# Patient Record
Sex: Female | Born: 1975 | Race: White | Hispanic: No | Marital: Married | Smoking: Never smoker
Health system: Southern US, Community
[De-identification: ages and names within clinical notes are randomized; demographics above are authoritative.]

## PROBLEM LIST (undated history)

## (undated) DIAGNOSIS — D709 Neutropenia, unspecified: Secondary | ICD-10-CM

## (undated) DIAGNOSIS — G901 Familial dysautonomia [Riley-Day]: Secondary | ICD-10-CM

## (undated) DIAGNOSIS — G43909 Migraine, unspecified, not intractable, without status migrainosus: Secondary | ICD-10-CM

## (undated) DIAGNOSIS — G629 Polyneuropathy, unspecified: Secondary | ICD-10-CM

## (undated) DIAGNOSIS — M3500 Sicca syndrome, unspecified: Secondary | ICD-10-CM

## (undated) DIAGNOSIS — E063 Autoimmune thyroiditis: Secondary | ICD-10-CM

## (undated) DIAGNOSIS — M359 Systemic involvement of connective tissue, unspecified: Secondary | ICD-10-CM

## (undated) DIAGNOSIS — M810 Age-related osteoporosis without current pathological fracture: Secondary | ICD-10-CM

## (undated) HISTORY — DX: Polyneuropathy, unspecified: G62.9

## (undated) HISTORY — DX: Autoimmune thyroiditis: E06.3

## (undated) HISTORY — DX: Neutropenia, unspecified: D70.9

## (undated) HISTORY — DX: Migraine, unspecified, not intractable, without status migrainosus: G43.909

## (undated) HISTORY — DX: Age-related osteoporosis without current pathological fracture: M81.0

## (undated) HISTORY — DX: Systemic involvement of connective tissue, unspecified: M35.9

## (undated) HISTORY — DX: Familial dysautonomia (riley-day): G90.1

## (undated) HISTORY — DX: Sjogren syndrome, unspecified: M35.00

---
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
## 1997-02-15 DIAGNOSIS — I7300 Raynaud's syndrome without gangrene: Secondary | ICD-10-CM | POA: Insufficient documentation

## 2002-02-15 DIAGNOSIS — D709 Neutropenia, unspecified: Secondary | ICD-10-CM | POA: Insufficient documentation

## 2006-11-04 HISTORY — PX: OTHER SURGICAL HISTORY: SHX169

## 2007-02-16 DIAGNOSIS — E063 Autoimmune thyroiditis: Secondary | ICD-10-CM | POA: Insufficient documentation

## 2007-02-16 DIAGNOSIS — M810 Age-related osteoporosis without current pathological fracture: Secondary | ICD-10-CM | POA: Insufficient documentation

## 2009-02-15 DIAGNOSIS — I7381 Erythromelalgia: Secondary | ICD-10-CM | POA: Insufficient documentation

## 2014-10-12 DIAGNOSIS — R52 Pain, unspecified: Secondary | ICD-10-CM | POA: Insufficient documentation

## 2015-10-11 DIAGNOSIS — G629 Polyneuropathy, unspecified: Secondary | ICD-10-CM | POA: Insufficient documentation

## 2015-10-11 DIAGNOSIS — G43909 Migraine, unspecified, not intractable, without status migrainosus: Secondary | ICD-10-CM | POA: Insufficient documentation

## 2015-10-11 DIAGNOSIS — K909 Intestinal malabsorption, unspecified: Secondary | ICD-10-CM | POA: Insufficient documentation

## 2016-03-22 DIAGNOSIS — R768 Other specified abnormal immunological findings in serum: Secondary | ICD-10-CM | POA: Insufficient documentation

## 2016-03-22 DIAGNOSIS — I7300 Raynaud's syndrome without gangrene: Secondary | ICD-10-CM | POA: Insufficient documentation

## 2016-03-22 DIAGNOSIS — I700 Atherosclerosis of aorta: Secondary | ICD-10-CM | POA: Insufficient documentation

## 2016-03-22 DIAGNOSIS — G608 Other hereditary and idiopathic neuropathies: Secondary | ICD-10-CM | POA: Insufficient documentation

## 2016-08-16 DIAGNOSIS — Q7649 Other congenital malformations of spine, not associated with scoliosis: Secondary | ICD-10-CM | POA: Insufficient documentation

## 2016-09-19 DIAGNOSIS — R682 Dry mouth, unspecified: Secondary | ICD-10-CM | POA: Insufficient documentation

## 2016-09-19 DIAGNOSIS — M3500 Sicca syndrome, unspecified: Secondary | ICD-10-CM | POA: Insufficient documentation

## 2016-11-19 DIAGNOSIS — Z79899 Other long term (current) drug therapy: Secondary | ICD-10-CM | POA: Insufficient documentation

## 2018-05-28 ENCOUNTER — Ambulatory Visit: Payer: BLUE CROSS/BLUE SHIELD | Attending: Orthopedic Surgery | Admitting: Physical Therapy

## 2018-05-28 ENCOUNTER — Encounter: Payer: Self-pay | Admitting: Physical Therapy

## 2018-05-28 DIAGNOSIS — M25551 Pain in right hip: Secondary | ICD-10-CM

## 2018-05-28 DIAGNOSIS — R262 Difficulty in walking, not elsewhere classified: Secondary | ICD-10-CM | POA: Insufficient documentation

## 2018-05-28 NOTE — Therapy (Signed)
[facility] [address] [place], [place], [address] Phone: [phone]   Fax:  [phone]  Physical Therapy Evaluation  Patient Details  Name: Leslie Ortiz MRN: [id number] Date of Birth: 03/17/1976 Referring Provider: Lequita HaltAluisio   Encounter Date: 05/28/2018  PT End of Session - 05/28/18 1441    Visit Number  1    Date for PT Re-Evaluation  07/29/18    PT Start Time  1355    PT Stop Time  1445    PT Time Calculation (min)  50 min    Activity Tolerance  Patient tolerated treatment well    Behavior During Therapy  [facility] assessed/performed       History reviewed. No pertinent past medical history.  History reviewed. No pertinent surgical history.  There were no vitals filed for this visit.   Subjective Assessment - 05/28/18 1357    Subjective  Patient reports that she was tripped by dog and sustained a displaced fracture of the greater trochanter, reports that no surgery was needed.  The fall was on May 18th.  REports she used crutches and a walker for about 4-6 weeks with mostly NWBing.      Limitations  Walking;House hold activities    Patient Stated Goals  have no pain, walk better, increased strength, return to pickleball    Currently in Pain?  Yes    Pain Score  2     Pain Location  Hip    Pain Orientation  Right;Anterior;Lateral    Pain Descriptors / Indicators  Aching;Sharp    Pain Type  Acute pain    Pain Radiating Towards  denies    Pain Onset  More than a month ago    Pain Frequency  Constant    Aggravating Factors   twisting, pivoting squatting pain up to 6/10    Pain Relieving Factors  rest, pain can be 0/10    Effect of Pain on Daily Activities  difficulty putting on shoes, cannot play pickle ball         [facility] PT Assessment - 05/28/18 0001      Assessment   Medical Diagnosis  right trochanter fracture    Referring Provider  Aluisio    Onset Date/Surgical Date  03/21/18    Prior Therapy   no      Precautions   Precautions  None      Balance Screen   Has the patient fallen in the past 6 months  Yes    How Ortiz times?  1    Has the patient had a decrease in activity level because of a fear of falling?   No    Is the patient reluctant to leave their home because of a fear of falling?   No      Home Environment   Additional Comments  has stairs, does housework      Prior Function   Level of Independence  Independent    Vocation  Unemployed    Leisure  plays pickleballeveryday      ROM / Strength   AROM / PROM / Strength  AROM;Strength      AROM   AROM Assessment Site  Hip    Right/Left Hip  Right    Right Hip Extension  5    Right Hip Flexion  95    Right Hip External Rotation   20    Right Hip ABduction  20  Strength   Strength Assessment Site  Hip    Right/Left Hip  Right    Right Hip Flexion  4-/5    Right Hip Extension  4-/5    Right Hip External Rotation   3+/5    Right Hip Internal Rotation  4-/5    Right Hip ABduction  4-/5    Right Hip ADduction  4-/5      Flexibility   Soft Tissue Assessment /Muscle Length  yes    Hamstrings  tight    Quadriceps  tight iwth ASIS pain    ITB  tight    Piriformis  tight with some pain      Palpation   Palpation comment  has tenderness in the right GT and ASIS area      Ambulation/Gait   Gait Comments  has decreased toe off, decreased pelvic rotation at toe off, mild antalgic gait, can do stairs step over step but has difficulty coming up due to weakness                Objective measurements completed on examination: See above findings.              PT Education - 05/28/18 1440    Education Details  HEP    Person(s) Educated  Patient    Methods  Explanation;Demonstration;Verbal cues;Handout    Comprehension  Verbalized understanding       PT Short Term Goals - 05/28/18 1719      PT SHORT TERM GOAL #1   Title  independent with initial HEP    Time  8    Period  Weeks     Status  New      PT SHORT TERM GOAL #2   Title  decrease pain 50%     Time  8    Period  Weeks    Status  New      PT SHORT TERM GOAL #3   Title  go up and down stairs without difficulty or pain    Time  8    Period  Weeks    Status  New      PT SHORT TERM GOAL #4   Title  play pickleball without difficulty    Time  8    Period  Weeks    Status  New      PT SHORT TERM GOAL #5   Title  put on shoes and socks without difficulty    Time  8    Period  Weeks    Status  New                Plan - 05/28/18 1533    Clinical Impression Statement  Patient had a fall and fractured her right hip, she was NWBing for about 5 weeks.  She reports recent x-ray showed good healing, she is having some anterior and lateral pain, she is very tight in the right hip.  Has mild decreased toe off with decreased pelvic rotation.  Weak and limited hip flexion.  Difficulty putting on shoes and socks, her goal is to return to playing pickleball in September    Clinical Presentation  Evolving    Clinical Decision Making  Low    Rehab Potential  Good    PT Frequency  2x / week    PT Duration  8 weeks    PT Treatment/Interventions  ADLs/Self Care Home Management;Cryotherapy;Electrical Stimulation;Moist Heat;Iontophoresis 4mg /ml Dexamethasone;Gait training;Stair training;Functional mobility training;Therapeutic activities;Therapeutic exercise;Balance training;Manual techniques;Patient/family  education    Consulted and Agree with Plan of Care  Patient       Patient will benefit from skilled therapeutic intervention in order to improve the following deficits and impairments:  Abnormal gait, Decreased range of motion, Difficulty walking, Decreased activity tolerance, Pain, Decreased balance, Impaired flexibility, Decreased strength, Decreased mobility  Visit Diagnosis: Pain in right hip - Plan: PT plan of care cert/re-cert  Difficulty in walking, not elsewhere classified - Plan: PT plan of care  cert/re-cert     Problem List There are no active problems to display for this patient.   Jearld Lesch., PT 05/28/2018, 5:22 PM  [facility] [address] [place], [place], [address] Phone: [phone]   Fax:  [phone]  Name: Munira Polson MRN: [id number] Date of Birth: 07/09/1976

## 2018-05-28 NOTE — Patient Instructions (Signed)
<HTML><META HTTP-EQUIV="content-type" CONTENT="text/html;charset=utf-8"><STRONG>             Access Code: [email or web address]       <BR>URL: [email or web address]       <BR><SPAN id="[email or web address]">Date: 05/28/2018</SPAN>                                                     <BR>Prepared by: Stacie GlazeMichael Tangelia Sanson                             </STRONG>      <BR><BR><SPAN>Exercises</SPAN>      <UL class="program-note-list"><LI>Seated Table Hamstring Stretch                 - 4 reps                 - 2 sets                 - 20 hold                         - 2x daily                         - 7x weekly                </LI><LI>Supine Piriformis Stretch                 - 5 reps                 - 2 sets                 - 20 hold                         - 2x daily                         - 7x weekly                </LI><LI>Kneeling Hip Flexor Stretch                 - 5 reps                 - 2 sets                 - 20 hold                         - 2x daily                         - 7x weekly                </LI><LI>Supine Bilateral Hip Internal Rotation Stretch                 - 3 reps                 - 1 sets                 - 20 hold                         -  2x daily                         - 7x weekly                </LI><LI>Supine Bridge                 - 10 reps                 - 2 sets                 - 2 hold                         - 2x daily                         - 7x weekly                </LI><LI>Clamshell                 - 10 reps                 - 2 sets                 - 3 hold                         - 2x daily                         - 7x weekly                </LI><LI>Sidestepping                 - 15 reps                 - 2 sets                         - 2x daily                         - 7x weekly                </LI></UL>

## 2018-06-04 ENCOUNTER — Ambulatory Visit: Payer: BLUE CROSS/BLUE SHIELD | Attending: Orthopedic Surgery | Admitting: Physical Therapy

## 2018-06-04 DIAGNOSIS — M25551 Pain in right hip: Secondary | ICD-10-CM

## 2018-06-04 DIAGNOSIS — R262 Difficulty in walking, not elsewhere classified: Secondary | ICD-10-CM | POA: Diagnosis present

## 2018-06-04 NOTE — Therapy (Addendum)
[place], [place], [address] Phone: [phone]   Fax:  [phone]  Physical Therapy Treatment  Patient Details  Name: Leslie Ortiz MRN: [id number] Date of Birth: 12-19-1975 Referring Provider: Wynelle Link   Encounter Date: 06/04/2018  PT End of Session - 06/04/18 1542    Visit Number  2    Date for PT Re-Evaluation  07/29/18    PT Start Time  6579    PT Stop Time  1530    PT Time Calculation (min)  45 min    Activity Tolerance  Patient tolerated treatment well    Behavior During Therapy  [facility] for tasks assessed/performed       No past medical history on file.  No past surgical history on file.  There were no vitals filed for this visit.  Subjective Assessment - 06/04/18 1453    Subjective  Pt reports having a good day. the middle back is hurting more than the hip today.     Currently in Pain?  Yes    Pain Score  2     Pain Location  Back    Pain Orientation  Anterior;Right;Lateral                       [facility] Adult PT Treatment/Exercise - 06/04/18 0001      Exercises   Exercises  Lumbar;Knee/Hip      Lumbar Exercises: Stretches   Passive Hamstring Stretch  Right;3 reps;30 seconds    Piriformis Stretch  Right;3 reps;30 seconds      Lumbar Exercises: Supine   Ab Set  10 reps;1 second diaphragm breathing in hooklying    Clam  20 reps red theraband      Knee/Hip Exercises: Aerobic   Nustep  L3 6 min      Knee/Hip Exercises: Seated   Long Arc Quad  Both;2 sets    Long Arc Quad Weight  3 lbs.    Ball Squeeze  20      Knee/Hip Exercises: Supine   Bridges  Both;1 set;20 reps red theraband    Straight Leg Raises  Both;2 sets;10 reps               PT Short Term Goals - 05/28/18 1719      PT SHORT TERM GOAL #1   Title  independent with initial HEP    Time  8    Period  Weeks    Status  New      PT SHORT TERM GOAL #2   Title  decrease pain 50%     Time   8    Period  Weeks    Status  New      PT SHORT TERM GOAL #3   Title  go up and down stairs without difficulty or pain    Time  8    Period  Weeks    Status  New      PT SHORT TERM GOAL #4   Title  play pickleball without difficulty    Time  8    Period  Weeks    Status  New      PT SHORT TERM GOAL #5   Title  put on shoes and socks without difficulty    Time  8    Period  Weeks    Status  New  Plan - 06/04/18 1542    Clinical Impression Statement  Pt tolerated treatment well. Pt required verbal and tactile cueing to keep the ribs down and engage diaphragm and pelvic floor. Pt stated she was having trouble with HEP. SPTA reviewed them and pt stated she was much more confident after treatment. Pt reported she wants to be able to play pickleball on September 1st as a personal goal. Pt fatigued quickly with hip focused resisted exercise. Pt stated she got a lot out of analogies to help her understand exercises.     Rehab Potential  Good    PT Frequency  2x / week    PT Duration  8 weeks    PT Treatment/Interventions  ADLs/Self Care Home Management;Cryotherapy;Electrical Stimulation;Moist Heat;Iontophoresis 55m/ml Dexamethasone;Gait training;Stair training;Functional mobility training;Therapeutic activities;Therapeutic exercise;Balance training;Manual techniques;Patient/family education       Patient will benefit from skilled therapeutic intervention in order to improve the following deficits and impairments:  Abnormal gait, Decreased range of motion, Difficulty walking, Decreased activity tolerance, Pain, Decreased balance, Impaired flexibility, Decreased strength, Decreased mobility  Visit Diagnosis: Pain in right hip  Difficulty in walking, not elsewhere classified     Problem List There are no active problems to display for this patient.  PHYSICAL THERAPY DISCHARGE SUMMARY  Visits from Start of Care: 2  Plan: Patient agrees to discharge.  Patient  goals were not met. Patient is being discharged due to meeting the stated rehab goals.  ?????       SMaryelizabeth Ortiz SPTA 06/04/2018, 3:51 PM  [address] [address]Phone: [phone]  Fax:  [phone] Name: Leslie StangMRN: [id number] of Birth: 1January 25, 1977

## 2018-06-08 DIAGNOSIS — H9201 Otalgia, right ear: Secondary | ICD-10-CM | POA: Insufficient documentation

## 2018-06-18 ENCOUNTER — Ambulatory Visit: Payer: BLUE CROSS/BLUE SHIELD | Admitting: Physical Therapy

## 2019-01-04 DIAGNOSIS — R5382 Chronic fatigue, unspecified: Secondary | ICD-10-CM | POA: Insufficient documentation

## 2019-02-16 DIAGNOSIS — R55 Syncope and collapse: Secondary | ICD-10-CM | POA: Insufficient documentation

## 2019-02-16 DIAGNOSIS — G901 Familial dysautonomia [Riley-Day]: Secondary | ICD-10-CM | POA: Insufficient documentation

## 2020-01-20 ENCOUNTER — Ambulatory Visit: Payer: BC Managed Care – PPO | Attending: Internal Medicine

## 2020-01-20 DIAGNOSIS — Z23 Encounter for immunization: Secondary | ICD-10-CM

## 2020-01-20 NOTE — Progress Notes (Signed)
   Covid-19 Vaccination Clinic  Name:  Leslie Ortiz    MRN: [id number] DOB: [birth year]  01/20/2020  [name] was observed post Covid-19 immunization for 30 minutes based on pre-vaccination screening without incident. She was provided with Vaccine Information Sheet and instruction to access the V-Safe system.   [name] was instructed to call 911 with any severe reactions post vaccine: Marland Kitchen Difficulty breathing  . Swelling of face and throat  . A fast heartbeat  . A bad rash all over body  . Dizziness and weakness   Immunizations Administered    Name Date Dose VIS Date Route   Pfizer COVID-19 Vaccine 01/20/2020  3:19 PM 0.3 mL 10/15/2019 Intramuscular   Manufacturer: [employer], [employer]   Lot: [id number]   NDC: [address]-5

## 2020-02-14 ENCOUNTER — Ambulatory Visit: Payer: BC Managed Care – PPO | Attending: Internal Medicine

## 2020-02-14 DIAGNOSIS — Z23 Encounter for immunization: Secondary | ICD-10-CM

## 2020-02-14 NOTE — Progress Notes (Signed)
   Covid-19 Vaccination Clinic  Name:  Leslie Ortiz    MRN: [id number] DOB: 07/08/1976  02/14/2020  [name] was observed post Covid-19 immunization for 15 minutes without incident. She was provided with Vaccine Information Sheet and instruction to access the V-Safe system.   [name] was instructed to call 911 with any severe reactions post vaccine: Marland Kitchen Difficulty breathing  . Swelling of face and throat  . A fast heartbeat  . A bad rash all over body  . Dizziness and weakness   Immunizations Administered    Name Date Dose VIS Date Route   Pfizer COVID-19 Vaccine 02/14/2020  3:37 PM 0.3 mL 10/15/2019 Intramuscular   Manufacturer: [employer], [employer]   Lot: [id number]   NDC: [address]-7

## 2020-02-16 DIAGNOSIS — S0300XA Dislocation of jaw, unspecified side, initial encounter: Secondary | ICD-10-CM | POA: Insufficient documentation

## 2020-02-16 DIAGNOSIS — M6289 Other specified disorders of muscle: Secondary | ICD-10-CM | POA: Insufficient documentation

## 2020-03-28 DIAGNOSIS — D229 Melanocytic nevi, unspecified: Secondary | ICD-10-CM | POA: Insufficient documentation

## 2020-03-28 DIAGNOSIS — D239 Other benign neoplasm of skin, unspecified: Secondary | ICD-10-CM | POA: Insufficient documentation

## 2020-07-25 ENCOUNTER — Ambulatory Visit: Payer: BC Managed Care – PPO | Admitting: Allergy and Immunology

## 2020-07-25 ENCOUNTER — Encounter: Payer: Self-pay | Admitting: Allergy and Immunology

## 2020-07-25 ENCOUNTER — Other Ambulatory Visit: Payer: Self-pay

## 2020-07-25 VITALS — BP 102/56 | HR 73 | Temp 98.5°F | Resp 16 | Ht 64.5 in | Wt 90.0 lb

## 2020-07-25 DIAGNOSIS — T7800XA Anaphylactic reaction due to unspecified food, initial encounter: Secondary | ICD-10-CM

## 2020-07-25 DIAGNOSIS — J3089 Other allergic rhinitis: Secondary | ICD-10-CM | POA: Diagnosis not present

## 2020-07-25 DIAGNOSIS — H1013 Acute atopic conjunctivitis, bilateral: Secondary | ICD-10-CM | POA: Diagnosis not present

## 2020-07-25 DIAGNOSIS — H1010 Acute atopic conjunctivitis, unspecified eye: Secondary | ICD-10-CM | POA: Insufficient documentation

## 2020-07-25 MED ORDER — EPINEPHRINE 0.3 MG/0.3ML IJ SOAJ: 0.3000 mg | INTRAMUSCULAR | 1 refills | Status: AC | PRN

## 2020-07-25 MED ORDER — IPRATROPIUM BROMIDE 0.06 % NA SOLN: NASAL | 5 refills | Status: DC

## 2020-07-25 NOTE — Assessment & Plan Note (Addendum)
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  Food allergen skin test the cellar was negative.  The negative predictive value for food and allergen skin testing is excellent, approximately 95%.  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  Careful avoidance of coconut as discussed.  A prescription has been provided for epinephrine auto-injector 2 pack along with instructions for proper administration.  A food allergy action plan has been provided and discussed.

## 2020-07-25 NOTE — Assessment & Plan Note (Signed)
   Aeroallergen avoidance measures have been discussed and provided in written form.  A prescription has been provided for ipratropium 0.06% nasal spray, 1-2 sprays per nostril 2 or 3 times daily if needed.  Nasal saline spray or nasal saline lavage (i.e., NeilMed) is recommended as needed and prior to medicated nasal sprays.

## 2020-07-25 NOTE — Patient Instructions (Addendum)
Allergic rhinitis with a nonallergic component  Aeroallergen avoidance measures have been discussed and provided in written form.  A prescription has been provided for ipratropium 0.06% nasal spray, 1-2 sprays per nostril 2 or 3 times daily if needed.  Nasal saline spray or nasal saline lavage (i.e., NeilMed) is recommended as needed and prior to medicated nasal sprays.  Allergic conjunctivitis  Treatment plan as outlined above for allergic rhinitis.  I have also recommended eye lubricant drops (i.e., Natural Tears) as needed.  Food allergy The patient's history suggests coconut allergy and positive skin test result today confirm this diagnosis.  Food allergen skin test the cellar was negative.  The negative predictive value for food and allergen skin testing is excellent, approximately 95%.  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  Careful avoidance of coconut as discussed.  A prescription has been provided for epinephrine auto-injector 2 pack along with instructions for proper administration.  A food allergy action plan has been provided and discussed.   Return in about 4 months (around 11/24/2020), or if symptoms worsen or fail to improve.  Control of House Dust Mite Allergen  House dust mites play a major role in allergic asthma and rhinitis.  They occur in environments with high humidity wherever human skin, the food for dust mites is found. High levels have been detected in dust obtained from mattresses, pillows, carpets, upholstered furniture, bed covers, clothes and soft toys.  The principal allergen of the house dust mite is found in its feces.  A gram of dust may contain 1,000 mites and 250,000 fecal particles.  Mite antigen is easily measured in the air during house cleaning activities.    1. Encase mattresses,  including the box spring, and pillow, in an air tight cover.  Seal the zipper end of the encased mattresses with wide adhesive tape. 2. Wash the bedding in water of 130 degrees Farenheit weekly.  Avoid cotton comforters/quilts and flannel bedding: the most ideal bed covering is the dacron comforter. 3. Remove all upholstered furniture from the bedroom. 4. Remove carpets, carpet padding, rugs, and non-washable window drapes from the bedroom.  Wash drapes weekly or use plastic window coverings. 5. Remove all non-washable stuffed toys from the bedroom.  Wash stuffed toys weekly. 6. Have the room cleaned frequently with a vacuum cleaner and a damp dust-mop.  The patient should not be in a room which is being cleaned and should wait 1 hour after cleaning before going into the room. 7. Close and seal all heating outlets in the bedroom.  Otherwise, the room will become filled with dust-laden air.  An electric heater can be used to heat the room. 8. Reduce indoor humidity to less than 50%.  Do not use a humidifier.  Control of Dog or Cat Allergen  Avoidance is the best way to manage a dog or cat allergy. If you have a dog or cat and are allergic to dog or cats, consider removing the dog or cat from the home. If you have a dog or cat but don't want to find it a new home, or if your family wants a pet even though someone in the household is allergic, here are some strategies that may help keep symptoms at bay:  1. Keep the pet out of your bedroom and restrict  it to only a few rooms. Be advised that keeping the dog or cat in only one room will not limit the allergens to that room. 2. Don't pet, hug or kiss the dog or cat; if you do, wash your hands with soap and water. 3. High-efficiency particulate air (HEPA) cleaners run continuously in a bedroom or living room can reduce allergen levels over time. 4. Place electrostatic material sheet in the air inlet vent in the bedroom. 5. Regular use of a  high-efficiency vacuum cleaner or a central vacuum can reduce allergen levels. 6. Giving your dog or cat a bath at least once a week can reduce airborne allergen.

## 2020-07-25 NOTE — Assessment & Plan Note (Signed)
   Treatment plan as outlined above for allergic rhinitis.  I have also recommended eye lubricant drops (i.e., Natural Tears) as needed. 

## 2020-07-25 NOTE — Progress Notes (Signed)
New Patient Note  RE: Leslie Ortiz MRN: [id number] DOB: 08/27/1976 Date of Office Visit: 07/25/2020  Referring provider: Laqueta Due., MD Primary care provider: Laqueta Due., MD  Chief Complaint: Sinus Problem and Nasal Congestion   History of present illness: Leslie Ortiz is a 44 y.o. female with autoimmune disease seen today in consultation requested by Ninetta Lights, MD.  She complains of nasal congestion, sinus pressure over the forehead, sinus pressure over the cheekbones, rhinorrhea, postnasal drainage, and periorbital edema.  The symptoms have progressed over the past 2 months.  The symptoms seem to be worse when she goes outdoors and also may be triggered by exposure to a cat.  Her symptoms are also triggered by rapid weather changes and strong aromas, such as perfumes and detergents.  She experiences occasional ocular pruritus, however is uncertain of this is due to environmental allergen exposure or Sjogren's syndrome.  She currently does not take medication for this problem but will use nasal saline spray or nasal saline lavage when needed.  She has no history of symptoms consistent with eczema or asthma. When she was 44 years old she consumed fresh coconut developed hives on her abdomen.  She has avoided fresh coconut since that time.  She is able to consume cooked coconut. Celery has been listed as an allergy on her medical chart for years.  She has had a reaction to Celebrex in the past and is wondering if possibly Salary was written by accident and then carried forward on her chart.  She has not consumed celery for many years but does not recall ever having had a reaction to it.  Assessment and plan: Allergic rhinitis with a nonallergic component  Aeroallergen avoidance measures have been discussed and provided in written form.  A prescription has been provided for ipratropium 0.06% nasal spray, 1-2 sprays per nostril 2 or 3 times daily if needed.  Nasal saline spray or nasal  saline lavage (i.e., NeilMed) is recommended as needed and prior to medicated nasal sprays.  Allergic conjunctivitis  Treatment plan as outlined above for allergic rhinitis.  I have also recommended eye lubricant drops (i.e., Natural Tears) as needed.  Food allergy The patient's history suggests coconut allergy and positive skin test result today confirm this diagnosis.  Food allergen skin test the cellar was negative.  The negative predictive value for food and allergen skin testing is excellent, approximately 95%.  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  Careful avoidance of coconut as discussed.  A prescription has been provided for epinephrine auto-injector 2 pack along with instructions for proper administration.  A food allergy action plan has been provided and discussed.   Meds ordered this encounter  Medications  . EPINEPHrine (AUVI-Q) 0.3 mg/0.3 mL IJ SOAJ injection    Sig: Inject 0.3 mg into the muscle as needed for anaphylaxis.    Dispense:  2 each    Refill:  1  . ipratropium (ATROVENT) 0.06 % nasal spray    Sig: Take 1-2 sprays per nostril 2 or 3 times daily if needed    Dispense:  15 mL    Refill:  5    Diagnostics: Epicutaneous testing: Reactivity to dust mite antigen. Intradermal testing: Reactivity to cat hair. Food allergen skin testing: Reactivity to coconut.  Physical examination: Blood pressure (!) 102/56, pulse 73, temperature 98.5 F (36.9 C), temperature source Oral, resp. rate 16, height 5' 4.5" (1.638 m), weight 90 lb (40.8 kg), SpO2 100 %.  General: Alert, interactive, in no acute distress. HEENT: TMs pearly gray, turbinates mildly edematous without discharge, post-pharynx moderately erythematous. Neck: Supple without lymphadenopathy. Lungs: Clear to auscultation without wheezing,  rhonchi or rales. CV: Normal S1, S2 without murmurs. Abdomen: Nondistended, nontender. Skin: Warm and dry, without lesions or rashes. Extremities:  No clubbing, cyanosis or edema. Neuro:   Grossly intact.  Review of systems:  Review of systems negative except as noted in HPI / PMHx or noted below: Review of Systems  Constitutional: Negative.   HENT: Negative.   Eyes: Negative.   Respiratory: Negative.   Cardiovascular: Negative.   Gastrointestinal: Negative.   Genitourinary: Negative.   Musculoskeletal: Negative.   Skin: Negative.   Neurological: Negative.   Endo/Heme/Allergies: Negative.   Psychiatric/Behavioral: Negative.     Past medical history:  Past Medical History:  Diagnosis Date  . Sjogren's disease (HCC)   . Undifferentiated connective tissue disease (HCC)     Past surgical history:  Past Surgical History:  Procedure Laterality Date  . tear duct replaced Left 2008  . WISDOM TOOTH EXTRACTION  1991    Family history: Family History  Problem Relation Age of Onset  . Allergic rhinitis Sister   . Allergic rhinitis Brother   . Bronchitis Brother   . Bronchitis Maternal Aunt   . Allergic rhinitis Paternal Grandmother   . Lupus Paternal Grandmother   . Asthma Neg Hx   . Eczema Neg Hx   . Urticaria Neg Hx   . Immunodeficiency Neg Hx   . Angioedema Neg Hx     Social history: Social History   Socioeconomic History  . Marital status: Married    Spouse name: Not on file  . Number of children: Not on file  . Years of education: Not on file  . Highest education level: Not on file  Occupational History  . Not on file  Tobacco Use  . Smoking status: Never Smoker  . Smokeless tobacco: Never Used  Vaping Use  . Vaping Use: Never used  Substance and Sexual Activity  . Alcohol use: Never  . Drug use: Never  . Sexual activity: Not on file  Other Topics Concern  . Not on file  Social History Narrative  . Not on file   Social Determinants of Health    Financial Resource Strain:   . Difficulty of Paying Living Expenses: Not on file  Food Insecurity:   . Worried About Programme researcher, broadcasting/film/video in the Last Year: Not on file  . Ran Out of Food in the Last Year: Not on file  Transportation Needs:   . Lack of Transportation (Medical): Not on file  . Lack of Transportation (Non-Medical): Not on file  Physical Activity:   . Days of Exercise per Week: Not on file  . Minutes of Exercise per Session: Not on file  Stress:   . Feeling of Stress : Not on file  Social Connections:   . Frequency of Communication with Friends and Family: Not on file  . Frequency of Social Gatherings with Friends and Family: Not on file  . Attends Religious Services: Not on file  . Active Member of Clubs or Organizations: Not on file  . Attends Banker Meetings: Not on file  . Marital Status: Not on file  Intimate Partner  Violence:   . Fear of Current or Ex-Partner: Not on file  . Emotionally Abused: Not on file  . Physically Abused: Not on file  . Sexually Abused: Not on file    Environmental History: She lives in a house built in 2006 with hardwood floors throughout, gas heat, and central air.  There is no known mold/water damage in the home.  There is a dog and cat in the home, the dog has access to her bedroom.  She is a non-smoker.  Current Outpatient Medications  Medication Sig Dispense Refill  . cycloSPORINE (RESTASIS) 0.05 % ophthalmic emulsion Restasis 0.05 % eye drops in a dropperette    . levothyroxine (SYNTHROID) 50 MCG tablet Take by mouth.    Marland Kitchen LYRICA 50 MG capsule Take 50 mg by mouth 3 (three) times daily.    Marland Kitchen PLAQUENIL 200 MG tablet Take 200 mg by mouth 2 (two) times daily.    Marland Kitchen EPINEPHrine (AUVI-Q) 0.3 mg/0.3 mL IJ SOAJ injection Inject 0.3 mg into the muscle as needed for anaphylaxis. 2 each 1  . ipratropium (ATROVENT) 0.06 % nasal spray Take 1-2 sprays per nostril 2 or 3 times daily if needed 15 mL 5   No current  facility-administered medications for this visit.    Known medication allergies: Allergies  Allergen Reactions  . Celecoxib Hives and Rash    Hives   . Epinephrine Other (See Comments) and Palpitations    Twitch, heart goes crazy   . Levofloxacin Hives and Rash    Hives   . Sulfa Antibiotics Hives and Other (See Comments)    hives   . Codeine Nausea And Vomiting    nausea  . Erythromycin Nausea And Vomiting  . Penicillins Hives and Rash    Hives Hives   . Coconut (Cocos Nucifera) Allergy Skin Test     Positive allergy test 07/25/20    I appreciate the opportunity to take part in Leslie Ortiz's care. Please do not hesitate to contact me with questions.  Sincerely,   R. Jorene Guest, MD

## 2020-07-31 ENCOUNTER — Telehealth: Payer: Self-pay | Admitting: Allergy and Immunology

## 2020-07-31 NOTE — Telephone Encounter (Signed)
Relayed message to patient

## 2020-07-31 NOTE — Telephone Encounter (Signed)
Please advise 

## 2020-07-31 NOTE — Telephone Encounter (Signed)
It is not unusual to have delayed local reactions after testing.  Using antihistamines, topical corticosteroids (such as hydrocortisone 1%), and ice can help to reduce the local reaction. Thanks.

## 2020-07-31 NOTE — Telephone Encounter (Signed)
Pt. Was in on Tuesday 9/21 skin testing was done, three of the areas where she was tested have gotten significantly worse overtime, it's a red bump as if she was bitten by a mosquito. Wanted to know if this is normal or a cause for concern.

## 2020-09-21 DIAGNOSIS — M542 Cervicalgia: Secondary | ICD-10-CM | POA: Insufficient documentation

## 2020-11-27 ENCOUNTER — Ambulatory Visit: Payer: BC Managed Care – PPO | Admitting: Allergy and Immunology

## 2021-02-15 DIAGNOSIS — G588 Other specified mononeuropathies: Secondary | ICD-10-CM | POA: Insufficient documentation

## 2021-03-22 DIAGNOSIS — M7070 Other bursitis of hip, unspecified hip: Secondary | ICD-10-CM | POA: Insufficient documentation

## 2021-04-10 ENCOUNTER — Ambulatory Visit: Payer: 59 | Admitting: Physical Therapy

## 2021-04-11 DIAGNOSIS — R202 Paresthesia of skin: Secondary | ICD-10-CM | POA: Insufficient documentation

## 2021-05-01 ENCOUNTER — Ambulatory Visit: Payer: 59 | Admitting: Physical Therapy

## 2021-05-03 ENCOUNTER — Encounter: Payer: Self-pay | Admitting: Neurology

## 2021-05-03 ENCOUNTER — Ambulatory Visit: Payer: 59 | Admitting: Neurology

## 2021-05-03 VITALS — BP 106/63 | HR 69 | Ht 64.5 in | Wt 93.0 lb

## 2021-05-03 DIAGNOSIS — R208 Other disturbances of skin sensation: Secondary | ICD-10-CM | POA: Diagnosis not present

## 2021-05-03 DIAGNOSIS — G4700 Insomnia, unspecified: Secondary | ICD-10-CM

## 2021-05-03 DIAGNOSIS — G4489 Other headache syndrome: Secondary | ICD-10-CM | POA: Diagnosis not present

## 2021-05-03 DIAGNOSIS — G6289 Other specified polyneuropathies: Secondary | ICD-10-CM | POA: Diagnosis not present

## 2021-05-03 NOTE — Progress Notes (Signed)
[facility]  PATIENT: Leslie Ortiz DOB: Oct 17, 1976  REFERRING DOCTOR OR PCP: Adline Mango, MD SOURCE: Patient, notes from primary care, notes from [place] [facility] and [place] [facility], biopsy results  _________________________________   HISTORICAL  CHIEF COMPLAINT:  Chief Complaint  Patient presents with   New Patient (Initial Visit)    RM 13, alone. Paper referral from Adline Mango, MD at [facility] for headache, small fiber neuropathy. Dx around 2016. Sx have worsened over time. Had EMG/NCS in the past. Prior to small fiber neuropathy biopsy.     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  She is a 45 year old woman who reports burning pain in her legs and feet.   She has superimposed numbness and tingling and sometimes has zapping dysesthesias.   Her hand and feet sometimes turn red, especially in warm temperature and they become very uncomfortable.   They are hot to the touch if others feel tem).   She feels her skin temperature regulatin is poor, in limbs and head.    She had the onset of heat intolerance in 2014.  She felt that she did not sweat after exertion.  Around that time she also noted dry eyes and dry mouth.  In 2014 or 2015 she began to experience dysesthesias.  Specifically she will have a burning type pain.   [facility] that [facility].  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  The skin biopsy showed significant but mildly reduced epidermal fiber density in the thigh (> 8.3 was normal and she was 6.99) but normal in calf (< 4.9 normal and she was 8.75). Marland Kitchen  She was diagnosed with Sjogren's syndrome.  She does not have SSA or SSB but does have a positive ANA.  She was placed on Plaquenil by rheumatology.  She had a tilt table test 04/25/2021   and it showed "Tilt table study positive for neurocardiogenic mechanism to explain the patient's clinical symptoms.  Patient with mainly a  Vaso-depressor response"     MRI of the head 02/28/2021 was normal.    She saw Dolan Amen in 2018.    She gets a lot of headaches, mostly n the forehead.  Sometimes she wakes up with them and other times they develop in the afternoon.   If they occur later in the day, they may trigger a migraine.      She sleeps poorly at night  Medications tried: Lyrica was started in 2006 for headaches and she has continued it at 50 mg once a day and increases to 3 times a day if pain is much worse with some benefit.  Twice a day is tolerated.    She has tried topiramate but it was poorly tolerated.   For headaches she had tried Teacher, music and it caused a very dry mouth (she already has a dry mouth associated with her diagnosis of Sjogren's).     Aspirin had not helped her symptoms and upset her stomach.   Prednisone made her feel better but she has osteoporosis.  Her mom has lupus and she has an aunt with a myeloproliferative disorder. She was not aware of any family history of erythromelalgia.  LABS ESR 22   CRP normal Catecholamines - mildly elevated NE, DA B12 normal 05/17/2019:   ANA positive  1:320 speckled 2/72020  IgM slihtly high at 230  REVIEW OF SYSTEMS: Constitutional: No fevers, chills, sweats, or change in appetite Eyes: No visual changes, double vision, eye pain Ear, nose and throat: No hearing loss, ear pain, nasal congestion, sore throat.  She has dry mouth and dry eyes. Cardiovascular: No chest pain, palpitations Respiratory:  No shortness of breath at rest or with exertion.   No wheezes GastrointestinaI: No nausea, vomiting, diarrhea, abdominal pain, fecal incontinence Genitourinary:  No dysuria, urinary retention or frequency.  No nocturia. Musculoskeletal:  No neck pain, back pain Integumentary: No rash, pruritus, skin lesions Neurological: as  above Psychiatric: No depression at this time.  No anxiety Endocrine: No palpitations, diaphoresis, change in appetite, change in weigh or increased thirst Hematologic/Lymphatic:  No anemia, purpura, petechiae. Allergic/Immunologic: She has a diagnosis of Sjogren's disease.  No itchy/runny eyes, nasal congestion, recent allergic reactions, rashes  ALLERGIES: Allergies  Allergen Reactions   Celecoxib Hives and Rash    Hives    Epinephrine Other (See Comments) and Palpitations    Twitch, heart goes crazy    Levofloxacin Hives and Rash    Hives    Sulfa Antibiotics Hives and Other (See Comments)    hives    Codeine Nausea And Vomiting    nausea   Erythromycin Nausea And Vomiting   Penicillins Hives and Rash    Hives Hives    Coconut (Cocos Nucifera) Allergy Skin Test     Positive allergy test 07/25/20    HOME MEDICATIONS:  Current Outpatient Medications:    cycloSPORINE (RESTASIS) 0.05 % ophthalmic emulsion, Restasis 0.05 % eye drops in a dropperette, Disp: , Rfl:    EPINEPHrine (AUVI-Q) 0.3 mg/0.3 mL IJ SOAJ injection, Inject 0.3 mg into the muscle as needed for anaphylaxis., Disp: 2 each, Rfl: 1   levothyroxine (SYNTHROID) 50 MCG tablet, Take by mouth., Disp: , Rfl:    LYRICA 50 MG capsule, Take 50 mg by mouth 3 (three) times daily., Disp: , Rfl:    PLAQUENIL 200 MG tablet, Take 200 mg by mouth 2 (two) times daily., Disp: , Rfl:   PAST MEDICAL HISTORY: Past Medical History:  Diagnosis Date   Dysautonomia ([place])    Hashimoto's disease    Migraine    Neutropenia (HCC)    Osteoporosis    Sjogren's disease ([place])    Small fiber neuropathy    Undifferentiated connective tissue disease ([place])    Undifferentiated connective tissue disease ([place])     PAST SURGICAL HISTORY: Past Surgical History:  Procedure Laterality Date   tear duct replaced Left 2008   WISDOM TOOTH EXTRACTION  1991    FAMILY HISTORY: Family History  Problem Relation Age of Onset    Arthritis/Rheumatoid Mother    Hypertension Mother    Allergic rhinitis Sister    Allergic rhinitis Brother    Bronchitis Brother    Leukemia Maternal Grandmother    Allergic rhinitis Paternal Grandmother    Lupus Paternal Grandmother    Bronchitis Maternal Aunt    Asthma Neg Hx    Eczema Neg Hx    Urticaria Neg Hx    Immunodeficiency Neg Hx    Angioedema Neg Hx     SOCIAL HISTORY:  Social History   Socioeconomic History   Marital status: Married    Spouse name: Not on file   Number of children: 0   Years of education: 20   Highest education level: Not on file  Occupational History   Not on file  Tobacco Use   Smoking status: Never   Smokeless tobacco: Never  Vaping Use   Vaping Use: Never used  Substance and Sexual Activity   Alcohol use: Never   Drug use: Never   Sexual activity: Not on file  Other Topics Concern   Not on file  Social History Narrative   Right handed   Caffeine use:  coffee daily   Lives w/ family   Social Determinants of Health   Financial Resource Strain: Not on file  Food Insecurity: Not on file  Transportation Needs: Not on file  Physical Activity: Not on file  Stress: Not on file  Social Connections: Not on file  Intimate Partner Violence: Not on file     PHYSICAL EXAM  Vitals:   05/03/21 1018  BP: 106/63  Pulse: 69  Weight: 93 lb (42.2 kg)  Height: 5' 4.5" (1.638 m)    Body mass index is 15.72 kg/m.   General: The patient is well-developed and well-nourished and in no acute distress  HEENT:  Head is [place]/AT.  Sclera are anicteric.    Neck: No carotid bruits are noted.  The neck is nontender.  Cardiovascular: The heart has a regular rate and rhythm with a normal S1 and S2. There were no murmurs, gallops or rubs.    Skin: Extremities are without rash or  edema.  Hands are mildly red.  Musculoskeletal:  Back is nontender  Neurologic Exam  Mental status: The patient is alert and oriented x 3 at the time of the  examination. The patient has apparent normal recent and remote memory, with an apparently normal attention span and concentration ability.   Speech is normal.  Cranial nerves: Extraocular movements are full. Pupils are equal, round, and reactive to light and accomodation.  Visual fields are full.  Facial symmetry is present. There is good facial sensation to soft touch bilaterally.Facial strength is normal.  Trapezius and sternocleidomastoid strength is normal. No dysarthria is noted.  The tongue is midline, and the patient has symmetric elevation of the soft palate. No obvious hearing deficits are noted.  Motor:  Muscle bulk is normal.   Tone is normal. Strength is  5 / 5 in all 4 extremities.   Sensory: Sensory testing is intact to pinprick, soft touch and vibration sensation in the arms.  She has normal sensation at the ankles to pinprick but decreased sensation at the toes.  She has mildly reduced sensation to vibration at the ankles and toes..  Coordination: Cerebellar testing reveals good finger-nose-finger and heel-to-shin bilaterally.  Gait and station: Station is normal.   Gait is normal. Tandem gait is normal. Romberg is negative.   Reflexes: Deep tendon reflexes are symmetric and normal bilaterally.   Plantar responses are flexor.      ASSESSMENT AND PLAN  Small fiber polyneuropathy - Plan: Multiple Myeloma Panel (SPEP&IFE w/QIG)  Dysesthesia  Other headache syndrome  Insomnia, unspecified type  In summary, Ms. Barcomb is a 45 year old woman with heat intolerance, with painful dysesthesias.  She has a diagnosis of Sjogren's disease and also has dry mouth and dry eyes.  Skin biopsy was unusual, consistent with a small fiber polyneuropathy in the thigh but not in the calf.  She has had insomnia.  We will increase Lyrica to 50 mg po qAm and 100 mg po qHS.  Hopefully this will help with the dysesthesias as well as the insomnia.  If symptoms do not improve after a month, she will call  and we  can try lamotrigine or oxcarbazepine or Vimpat  She will return to see [place] in 4 months or sooner if new or worsening symptoms.  Thank you for asking me to see Ms. Sharlene Motts.  Please let me know if I can be of further assistance with her or other patients in the future.  Briseyda Fehr A. Felecia Shelling, MD, [facility] 6/70/1410, 3:01 PM Certified in Neurology, Clinical Neurophysiology, Sleep Medicine and Neuroimaging  [facility] [address] [place], [place] [address] [phone]

## 2021-05-06 LAB — MULTIPLE MYELOMA PANEL, SERUM
Albumin SerPl Elph-Mcnc: 4.3 g/dL (ref 2.9–4.4)
Albumin/Glob SerPl: 1.6 (ref 0.7–1.7)
Alpha 1: 0.2 g/dL (ref 0.0–0.4)
Alpha2 Glob SerPl Elph-Mcnc: 0.5 g/dL (ref 0.4–1.0)
B-Globulin SerPl Elph-Mcnc: 0.9 g/dL (ref 0.7–1.3)
Gamma Glob SerPl Elph-Mcnc: 1.1 g/dL (ref 0.4–1.8)
Globulin, Total: 2.7 g/dL (ref 2.2–3.9)
IgA/Immunoglobulin A, Serum: 240 mg/dL (ref 87–352)
IgG (Immunoglobin G), Serum: 1051 mg/dL (ref 586–1602)
IgM (Immunoglobulin M), Srm: 195 mg/dL (ref 26–217)
Total Protein: 7 g/dL (ref 6.0–8.5)

## 2021-07-18 DIAGNOSIS — G894 Chronic pain syndrome: Secondary | ICD-10-CM | POA: Insufficient documentation

## 2021-09-18 ENCOUNTER — Ambulatory Visit: Payer: 59 | Admitting: Neurology

## 2021-11-16 ENCOUNTER — Other Ambulatory Visit: Payer: Self-pay | Admitting: Dentistry

## 2021-11-16 DIAGNOSIS — M26633 Articular disc disorder of bilateral temporomandibular joint: Secondary | ICD-10-CM

## 2021-12-01 ENCOUNTER — Other Ambulatory Visit: Payer: Self-pay

## 2021-12-01 ENCOUNTER — Ambulatory Visit
Admission: RE | Admit: 2021-12-01 | Discharge: 2021-12-01 | Disposition: A | Discharge status: Home / Self Care | Payer: 59 | Source: Ambulatory Visit | Attending: Dentistry | Admitting: Dentistry

## 2021-12-01 DIAGNOSIS — M26633 Articular disc disorder of bilateral temporomandibular joint: Secondary | ICD-10-CM

## 2021-12-26 DIAGNOSIS — M791 Myalgia, unspecified site: Secondary | ICD-10-CM | POA: Insufficient documentation

## 2022-03-06 DIAGNOSIS — N94819 Vulvodynia, unspecified: Secondary | ICD-10-CM | POA: Insufficient documentation

## 2022-08-30 ENCOUNTER — Other Ambulatory Visit: Payer: Self-pay | Admitting: Internal Medicine

## 2022-08-30 DIAGNOSIS — M359 Systemic involvement of connective tissue, unspecified: Secondary | ICD-10-CM

## 2022-08-30 DIAGNOSIS — R634 Abnormal weight loss: Secondary | ICD-10-CM

## 2022-08-30 DIAGNOSIS — R194 Change in bowel habit: Secondary | ICD-10-CM

## 2022-09-12 ENCOUNTER — Ambulatory Visit
Admission: RE | Admit: 2022-09-12 | Discharge: 2022-09-12 | Disposition: A | Discharge status: Home / Self Care | Payer: 59 | Source: Ambulatory Visit | Attending: Internal Medicine | Admitting: Internal Medicine

## 2022-09-12 DIAGNOSIS — R634 Abnormal weight loss: Secondary | ICD-10-CM

## 2022-09-12 DIAGNOSIS — R194 Change in bowel habit: Secondary | ICD-10-CM

## 2022-09-12 DIAGNOSIS — M359 Systemic involvement of connective tissue, unspecified: Secondary | ICD-10-CM

## 2022-09-12 MED ORDER — IOPAMIDOL (ISOVUE-300) INJECTION 61%
100.0000 mL | Freq: Once | INTRAVENOUS | Status: AC | PRN
  Administered 2022-09-12: 100 mL via INTRAVENOUS

## 2022-10-24 DIAGNOSIS — E2839 Other primary ovarian failure: Secondary | ICD-10-CM | POA: Insufficient documentation

## 2023-02-19 NOTE — Progress Notes (Signed)
Office Visit Note  Patient: Leslie Ortiz             Date of Birth: 05/13/1976           MRN: [id number]             PCP: Laqueta Due., MD Referring: Laqueta Due., MD Visit Date: 02/20/2023   Subjective:  New Patient (Initial Visit) (Patient states she is here because of her Neurologist. Patient states she has muscle pain in random locations and nerve pain down her legs.)   History of Present Illness: Leslie Ortiz is a 47 y.o. female here for Sjogren syndrome. She previously saw [facility] with a long history of care at multiple previous sites. She had onset of several symptoms dating back to her 23s with multiple issues joint pain, unintentional weight loss, and raynaud's symptoms with associated toe lesions leading to workup with positive ANA. Also with proteinuria and she was diagnosed with suspected lupus around 2000 and treated with hydroxychloroquine without benefit and later methotreate that she did not tolerate. Apparently also treated with azathioprine stopped due to leukopenia. She was on a prolonged course of oral prednisone for years around 10 mg daily. Sjogren syndrome diagnosis was defined by 2006 with positive biopsy with [facility] system, positive ANA, hypocomplementemia, and leukopenia. She had seen Dr. Artis Flock with [facility] rheumatology maintained on hydroxychloroquine 200 mg daily. Prednisone was noted to improve symptoms of possible erythromelalgia and her myalgias. Was also treated with Lyrica 50 mg TID with improvement in pain and headache symptoms. Was seeing rheumatology intermittently, records of another workup with [facility] system in 2021. Currently has multiple ongoing symptoms attributed to sjogrens and neuropathy with sicca symptoms, raynaud's, photosensitive rashes, peripheral paresthesias, chronic pain and  autonomic dysfunction with GI malabsorption, neurocardiogenic syncope, and some inappropriate sweating and temperature regulation. For dry eye and irritation using Cequa  solution previously restasis.  DMARD Hx HCQ - Current treatment MTX - GI intolerance AZA - leukopenia (2005)   Previous documentation event summary from 2021 reviewed: 2000 - urine 24-hour protein 520 mg - ANA positive at a titer 1:80 (speckled pattern)  2002 - Began taking Plaquenil 200 mg twice daily,  2004 - DEXA scan T score of -2.2 for hip and -1.6 for spine - positive test for beta-2 glycoprotein 1 antibody  2006 - Rash biopsy thin atrophic epidermis with no interface activity and increased dermal mucin - Normal EMG and nerve conduction velocity study  2007 - Bone marrow biopsy showed cellular marrow with relative erythroid hyperplasia and normal trilineage maturation, storage iron present, no evidence of myelodysplasia - Liver biopsy at [facility] showing 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); MRI/MRCP did not show any features of primary sclerosing cholangitis, although it did reveal hepatomegaly with innumerable small hepatic cysts, prescribed ursodiol but the patient did not start it  2015 - Neurology evaluation for acral pain, paresthesia, and erythema, differential diagnosis included erythromelalgia, small fiber neuropathy, other forms of hereditary sensory neuropathy  2016 - Skin biopsy showed significantly reduced epidermal nerve fiber density consistent with small fiber neuropathy   2017 - Labial salivary gland biopsy focus score 1.3 per 4 mm^2 of tissue  Activities of Daily Living:  Patient reports morning stiffness for 0 minute.   Patient Reports nocturnal pain.  Difficulty dressing/grooming: Denies Difficulty climbing stairs: Denies Difficulty getting out of chair: Denies Difficulty using hands for taps, buttons, cutlery, and/or writing: Denies  Review of Systems  Constitutional:  Positive for fatigue.  HENT:  Positive for mouth dryness. Negative for mouth sores.   Eyes:  Positive for  dryness.  Respiratory:  Negative for shortness of breath.   Cardiovascular:  Positive for palpitations. Negative for chest pain.  Gastrointestinal:  Positive for constipation and diarrhea. Negative for blood in stool.  Endocrine: Positive for increased urination.  Genitourinary:  Negative for involuntary urination.  Musculoskeletal:  Positive for joint pain, joint pain, myalgias, muscle weakness, muscle tenderness and myalgias. Negative for gait problem, joint swelling and morning stiffness.  Skin:  Positive for color change and sensitivity to sunlight. Negative for rash and hair loss.  Allergic/Immunologic: Positive for susceptible to infections.  Neurological:  Positive for dizziness and headaches.  Hematological:  Positive for swollen glands.  Psychiatric/Behavioral:  Positive for sleep disturbance. Negative for depressed mood. The patient is not nervous/anxious.     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   Neck pain 09/21/2020   Allergic rhinitis with a nonallergic component 07/25/2020   Allergic conjunctivitis 07/25/2020   Food allergy 07/25/2020   Dermatofibroma 03/28/2020   Multiple benign nevi 03/28/2020   Pelvic floor dysfunction in female 02/16/2020   TMJ (dislocation of temporomandibular joint) 02/16/2020   Dysautonomia 02/16/2019   Neurocardiogenic syncope 02/16/2019   Chronic fatigue 01/04/2019   Referred otalgia of right ear 06/08/2018   High risk medication use 11/19/2016   Dry mouth 09/19/2016   Primary Sjogren's syndrome 09/19/2016   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   Positive ANA (antinuclear antibody) 03/22/2016   Calcification of aorta 03/22/2016   Raynaud's disease without gangrene 03/22/2016   Neuropathy 10/11/2015   Malabsorption syndrome 10/11/2015   Migraine 10/11/2015   Burning pain 10/12/2014   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   Osteoporosis 02/16/2007   Autoimmune thyroiditis 02/16/2007   Neutropenia 02/15/2002   Raynaud phenomenon 02/15/1997    Past Medical History:  Diagnosis Date   Dysautonomia    Hashimoto's disease    Migraine    Neutropenia    Osteoporosis    Sjogren's disease    Small fiber neuropathy    Undifferentiated connective tissue disease    Undifferentiated connective tissue disease     Family History  Problem Relation Age of Onset   Arthritis/Rheumatoid Mother    Hypertension Mother    Allergic rhinitis Sister    Allergic rhinitis Brother    Bronchitis Brother    Leukemia Maternal Grandmother    Allergic rhinitis Paternal Grandmother    Lupus Paternal Grandmother    Bronchitis Maternal Aunt    Asthma Neg Hx    Eczema Neg Hx    Urticaria Neg Hx    Immunodeficiency Neg Hx    Angioedema Neg Hx    Past Surgical History:  Procedure Laterality Date   tear duct replaced Left 2008   WISDOM TOOTH EXTRACTION  1991   Social History   Social History Narrative   Right handed   Caffeine use:  coffee daily   Lives w/ family   Immunization History  Administered Date(s) Administered   Influenza-Unspecified 08/04/2012   PFIZER Comirnaty(Gray  Top)Covid-19 Tri-Sucrose Vaccine 01/20/2020, 02/14/2020   PFIZER(Purple Top)SARS-COV-2 Vaccination 01/20/2020, 02/14/2020, 07/20/2020     Objective: Vital Signs: BP 105/65 (BP Location: Right Arm, Patient Position: Sitting, Cuff Size: Normal)   Pulse 69   Resp 12   Ht 5' 3.75" (1.619 m)   Wt 82 lb (37.2 kg)   LMP  (LMP Unknown) Comment: Having Irregular Periods  BMI 14.19 kg/m    Physical Exam Constitutional:       Comments: Underweight  HENT:     Mouth/Throat:     Pharynx: Oropharynx is clear.     Comments: Restricted jaw ROM, no palpable crepitus or swelling over TMJ Eyes:     Conjunctiva/sclera: Conjunctivae normal.  Cardiovascular:     Rate and Rhythm: Normal rate and regular rhythm.  Pulmonary:     Effort: Pulmonary effort is normal.     Breath sounds: Normal breath sounds.  Musculoskeletal:     Right lower leg: No edema.     Left lower leg: No edema.  Lymphadenopathy:     Cervical: No cervical adenopathy.  Skin:    Findings: No rash.     Comments: Dilated nailfold capillaries throughout both hands, no pitting, erythema on palms and from partway on proximal phalanx distally b/l  Neurological:     Mental Status: She is alert.  Psychiatric:        Mood and Affect: Mood normal.      Musculoskeletal Exam:  Elbows full ROM no tenderness or swelling Wrists full ROM no tenderness or swelling Fingers full ROM no tenderness or swelling Mildly restricted hip internal rotation without pain Knees full ROM no tenderness or swelling Ankles full ROM no tenderness or swelling   Investigation: No additional findings.  Imaging: No results found.  Recent Labs: Lab Results  Component Value Date   PROT 7.0 05/03/2021    Speciality Comments: No specialty comments available.  Procedures:  No procedures performed Allergies: Celecoxib, Epinephrine, Levofloxacin, Sulfa antibiotics, Codeine, Erythromycin, Penicillins, and [place] nucifera   Assessment / Plan:     Visit Diagnoses: Primary Sjogren's syndrome  Small fiber polyneuropathy - Plan: ANA, Anti-Smith antibody, Sjogrens syndrome-A extractable nuclear antibody, Anti-DNA antibody, double-stranded, C3 and C4, Rheumatoid factor, Sedimentation rate, C-reactive protein, IgG, IgA, IgM  Complicated history but appears to be fairly well established diagnosis from records review. Concern by neurologist is for progression of neuropathy ascending  from peripheral to more proximal distribution including now pudendal neuralgia although worst problems remain hands and feet. She has been on hydroxychloroquine at a relatively very high dosing for many years unclear about symptom efficacy and apparently not stopping associated progression. Will check antibody profile today also serum immunoglobulins and inflammatory markers try to assess ongoing disease activity. If truly has active nerve inflammation with active sjogrens would really need to consider more aggressive treatment whether resuming glucocorticoids, or infusion treatment such as rituximab. Will need to review findings and coordinate with neurology treatment plan.  Raynaud's phenomenon without gangrene  No lesions or critical digital ischemia evident at this time. Has history of previous ulcers especially on toes in the past.  Other osteoporosis without current pathological fracture  Previous greater trochanter fracture from fall. Multifactorial specific worst risks including malabsorption and chronic prednisone use.  Dry mouth  Supportive treatments to continue hydration and saliva stimulation no prescription treatment currently. Would not recommend secretagogue medication in general with existing autonomic dysfunction.  Orders: Orders Placed This Encounter  Procedures   ANA   Anti-Smith antibody  Sjogrens syndrome-A extractable nuclear antibody   Anti-DNA antibody, double-stranded   C3 and C4   Rheumatoid factor   Sedimentation rate   C-reactive protein   IgG, IgA, IgM   No orders of the defined types were placed in this encounter.    Follow-Up Instructions: No follow-ups on file.   Fuller Plan, MD  Note - This record has been created using [employer].  Chart creation errors have been sought, but may not always  have been located. Such creation errors do not reflect on  the standard of medical care.

## 2023-02-20 ENCOUNTER — Ambulatory Visit: Payer: 59 | Attending: Internal Medicine | Admitting: Internal Medicine

## 2023-02-20 ENCOUNTER — Encounter: Payer: Self-pay | Admitting: Internal Medicine

## 2023-02-20 VITALS — BP 105/65 | HR 69 | Resp 12 | Ht 63.75 in | Wt 82.0 lb

## 2023-02-20 DIAGNOSIS — G6289 Other specified polyneuropathies: Secondary | ICD-10-CM

## 2023-02-20 DIAGNOSIS — M818 Other osteoporosis without current pathological fracture: Secondary | ICD-10-CM

## 2023-02-20 DIAGNOSIS — K909 Intestinal malabsorption, unspecified: Secondary | ICD-10-CM

## 2023-02-20 DIAGNOSIS — I7381 Erythromelalgia: Secondary | ICD-10-CM

## 2023-02-20 DIAGNOSIS — R682 Dry mouth, unspecified: Secondary | ICD-10-CM

## 2023-02-20 DIAGNOSIS — I7300 Raynaud's syndrome without gangrene: Secondary | ICD-10-CM

## 2023-02-20 DIAGNOSIS — G901 Familial dysautonomia [Riley-Day]: Secondary | ICD-10-CM

## 2023-02-20 DIAGNOSIS — M3500 Sicca syndrome, unspecified: Secondary | ICD-10-CM

## 2023-02-25 LAB — C3 AND C4
C3 Complement: 80 mg/dL — ABNORMAL LOW (ref 83–193)
C4 Complement: 12 mg/dL — ABNORMAL LOW (ref 15–57)

## 2023-02-25 LAB — ANTI-SMITH ANTIBODY: ENA SM Ab Ser-aCnc: <1 AI

## 2023-02-25 LAB — RHEUMATOID FACTOR: Rheumatoid fact SerPl-aCnc: <10 IU/mL (ref <14)

## 2023-02-25 LAB — ANTI-NUCLEAR AB-TITER (ANA TITER): ANA Titer 1: 1:80 {titer} — ABNORMAL HIGH

## 2023-02-25 LAB — SEDIMENTATION RATE: Sed Rate: 9 mm/h (ref 0–20)

## 2023-02-25 LAB — SJOGRENS SYNDROME-A EXTRACTABLE NUCLEAR ANTIBODY: SSA (Ro) (ENA) Antibody, IgG: <1 AI

## 2023-02-25 LAB — C-REACTIVE PROTEIN: CRP: <3 mg/L (ref <8.0)

## 2023-02-25 LAB — ANA: Anti Nuclear Antibody (ANA): POSITIVE — AB

## 2023-02-25 LAB — IGG, IGA, IGM
IgG (Immunoglobin G), Serum: 1071 mg/dL (ref 600–1640)
IgM, Serum: 201 mg/dL (ref 50–300)
Immunoglobulin A: 266 mg/dL (ref 47–310)

## 2023-02-25 LAB — ANTI-DNA ANTIBODY, DOUBLE-STRANDED: ds DNA Ab: <1 IU/mL

## 2023-05-05 ENCOUNTER — Encounter: Payer: Self-pay | Admitting: Internal Medicine

## 2023-05-20 IMAGING — MR MR [PERSON_NAME]
11 series · 16 of 16 positions shown · non-contrast
Comparison: X-ray temporomandibular joint 06/11/2018.
COMPARISON: X-ray temporomandibular joint 06/11/2018.

Addendum:
CLINICAL DATA: Bilateral tightness and popping with facial
numbness/tingling for 3 months.

EXAM:
MRI OF TEMPOROMANDIBULAR JOINT WITHOUT CONTRAST
TECHNIQUE: Multiplanar, multisequence MR imaging of the temporomandibular joint
was performed following the standard protocol. No intravenous
contrast was administered.

[Series 7: T1 · axial · 4.0mm · 0.59mm/px · z∈[-59,+1]mm · 3 of 16 slices shown]
[im 1/16]
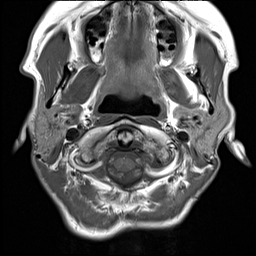
[im 8/16]
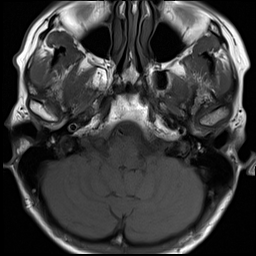
[im 16/16]
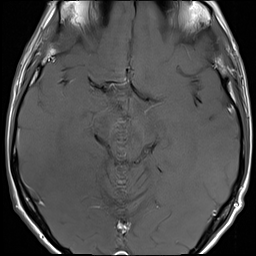

[Series 8: T2 fat-sat · sagittal · 4.0mm · 0.62mm/px · 2 of 13 slices shown (1 of 2)]
[im 1/13]
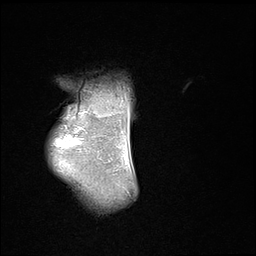
[im 13/13]
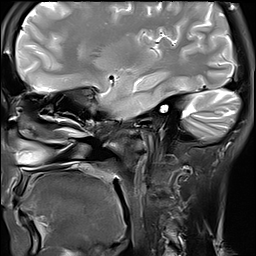

[Series 10: T2 fat-sat · sagittal · 4.0mm · 0.62mm/px · 2 of 13 slices shown (2 of 2)]
[im 1/13]
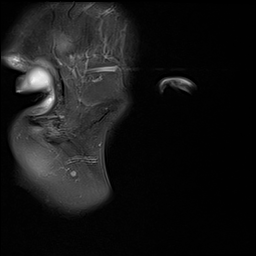
[im 13/13]
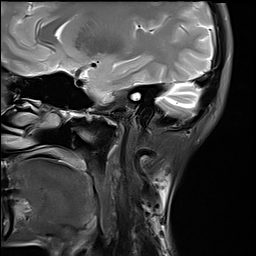

[Series 11: PD · coronal · 4.0mm · 0.44mm/px · 2 of 13 slices shown (1 of 8)]
[im 1/13]
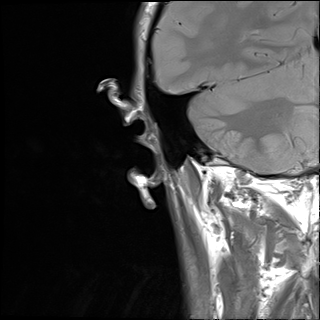
[im 13/13]
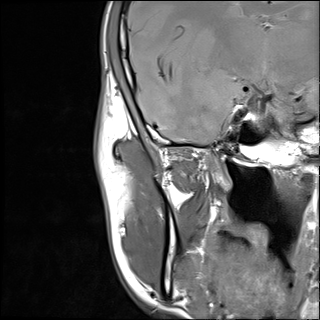

[Series 12: PD · coronal · 4.0mm · 0.44mm/px · 1 of 13 slices shown (2 of 8)]
[im 1/13]
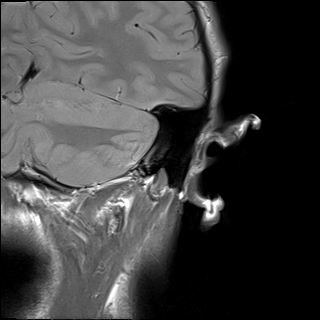

[Series 13: PD · sagittal · 4.0mm · 0.44mm/px · 1 of 13 slices shown (3 of 8)]
[im 1/13]
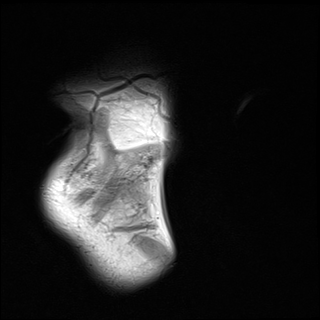

[Series 14: PD · sagittal · 4.0mm · 0.44mm/px · 1 of 12 slices shown (4 of 8)]
[im 1/12]
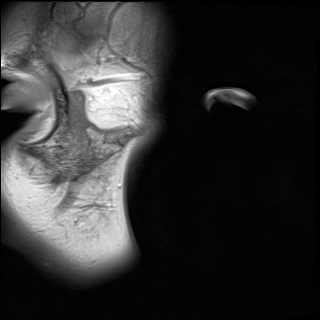

[Series 16: PD · sagittal · 4.0mm · 0.44mm/px · 1 of 13 slices shown (5 of 8)]
[im 1/13]
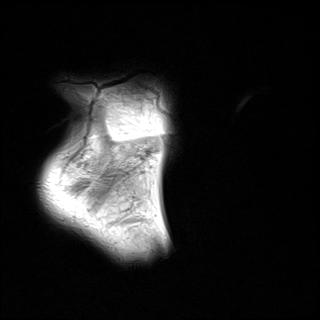

[Series 17: PD · sagittal · 4.0mm · 0.44mm/px · 1 of 12 slices shown (6 of 8)]
[im 1/12]
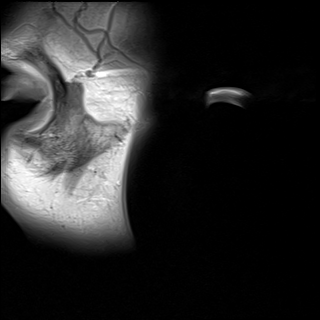

[Series 18: PD · coronal · 4.0mm · 0.44mm/px · 1 of 13 slices shown (7 of 8)]
[im 1/13]
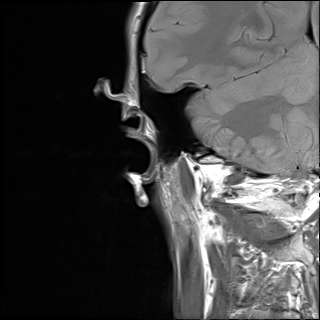

[Series 19: PD · coronal · 4.0mm · 0.44mm/px · 1 of 13 slices shown (8 of 8)]
[im 1/13]
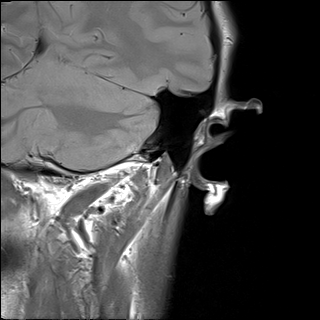

[16 of 16 positions shown; findings below may reference images not displayed]

FINDINGS: Right temporomandibular joint: The articular disc is normally
positioned between the mandibular condyle and the temporal bone in
both open and closed positions with degeneration of the articular
disc. There is anterior translation of the mandibular condyle with
jaw opening, but the condyle does not extend to the inferior most
aspect of the articular eminence. There is no joint effusion.

Left temporomandibular joint: The articular disc is normally
positioned between the mandibular condyle and the temporal bone in
both open and closed positions. Degeneration of the intermediate
zone and posterior band with probable small tear of the intermediate
zone. There is anterior translation of the mandibular condyle with
jaw opening, but the condyle does not extend to the inferior most
aspect of the articular eminence.There is no joint effusion.

Other: No acute fracture or subluxation. No aggressive osseous
lesion. Visualized brain demonstrates no focal abnormality. No fluid
collection or hematoma. Visualized paranasal sinuses are clear.
IMPRESSION: 1. Degeneration of the intermediate zone and posterior band of the
left temporomandibular joint with probable small tear of the
intermediate zone.
2. Degeneration of the articular disc of the right temporomandibular
joint.
3. Limited anterior translation of the mandibular condyle with jaw
opening with the condyle not extending to the inferior most aspect
of the articular eminence.

ADDENDUM:
A 23 mm bite plate was utilized during the open-mouth portion of the
examination. This could contribute to the overall relative decrease
translation of the mandibular condyle with the jaw open relative to
the articular eminence bilaterally.

*** End of Addendum ***
FINDINGS: Right temporomandibular joint: The articular disc is normally
positioned between the mandibular condyle and the temporal bone in
both open and closed positions with degeneration of the articular
disc. There is anterior translation of the mandibular condyle with
jaw opening, but the condyle does not extend to the inferior most
aspect of the articular eminence. There is no joint effusion.

Left temporomandibular joint: The articular disc is normally
positioned between the mandibular condyle and the temporal bone in
both open and closed positions. Degeneration of the intermediate
zone and posterior band with probable small tear of the intermediate
zone. There is anterior translation of the mandibular condyle with
jaw opening, but the condyle does not extend to the inferior most
aspect of the articular eminence.There is no joint effusion.

Other: No acute fracture or subluxation. No aggressive osseous
lesion. Visualized brain demonstrates no focal abnormality. No fluid
collection or hematoma. Visualized paranasal sinuses are clear.
IMPRESSION: 1. Degeneration of the intermediate zone and posterior band of the
left temporomandibular joint with probable small tear of the
intermediate zone.
2. Degeneration of the articular disc of the right temporomandibular
joint.
3. Limited anterior translation of the mandibular condyle with jaw
opening with the condyle not extending to the inferior most aspect
of the articular eminence.

## 2023-10-29 ENCOUNTER — Encounter (HOSPITAL_BASED_OUTPATIENT_CLINIC_OR_DEPARTMENT_OTHER): Payer: Self-pay

## 2023-10-29 ENCOUNTER — Emergency Department (HOSPITAL_BASED_OUTPATIENT_CLINIC_OR_DEPARTMENT_OTHER)
Admission: EM | Admit: 2023-10-29 | Discharge: 2023-10-29 | Disposition: A | Discharge status: Home / Self Care | Payer: 59 | Attending: Emergency Medicine | Admitting: Emergency Medicine

## 2023-10-29 ENCOUNTER — Emergency Department (HOSPITAL_BASED_OUTPATIENT_CLINIC_OR_DEPARTMENT_OTHER): Payer: 59

## 2023-10-29 ENCOUNTER — Other Ambulatory Visit: Payer: Self-pay

## 2023-10-29 DIAGNOSIS — Z1152 Encounter for screening for COVID-19: Secondary | ICD-10-CM | POA: Diagnosis not present

## 2023-10-29 DIAGNOSIS — R0789 Other chest pain: Secondary | ICD-10-CM | POA: Insufficient documentation

## 2023-10-29 LAB — BASIC METABOLIC PANEL
Anion gap: 7 (ref 5–15)
BUN: 12 mg/dL (ref 6–20)
CO2: 28 mmol/L (ref 22–32)
Calcium: 9.4 mg/dL (ref 8.9–10.3)
Chloride: 100 mmol/L (ref 98–111)
Creatinine, Ser: 0.58 mg/dL (ref 0.44–1.00)
GFR, Estimated: >60 mL/min (ref >60)
Glucose, Bld: 149 mg/dL — ABNORMAL HIGH (ref 70–99)
Potassium: 3.5 mmol/L (ref 3.5–5.1)
Sodium: 135 mmol/L (ref 135–145)

## 2023-10-29 LAB — RESP PANEL BY RT-PCR (RSV, FLU A&B, COVID)  RVPGX2
Influenza A by PCR: NEGATIVE
Influenza B by PCR: NEGATIVE
Resp Syncytial Virus by PCR: NEGATIVE
SARS Coronavirus 2 by RT PCR: NEGATIVE

## 2023-10-29 LAB — CBC
HCT: 38 % (ref 36.0–46.0)
Hemoglobin: 13.2 g/dL (ref 12.0–15.0)
MCH: 34.2 pg — ABNORMAL HIGH (ref 26.0–34.0)
MCHC: 34.7 g/dL (ref 30.0–36.0)
MCV: 98.4 fL (ref 80.0–100.0)
Platelets: 171 10*3/uL (ref 150–400)
RBC: 3.86 MIL/uL — ABNORMAL LOW (ref 3.87–5.11)
RDW: 12.1 % (ref 11.5–15.5)
WBC: 2.7 10*3/uL — ABNORMAL LOW (ref 4.0–10.5)
nRBC: 0 % (ref 0.0–0.2)

## 2023-10-29 LAB — TROPONIN I (HIGH SENSITIVITY)
Troponin I (High Sensitivity): 2 ng/L (ref <18)
Troponin I (High Sensitivity): 3 ng/L (ref <18)

## 2023-10-29 LAB — HCG, SERUM, QUALITATIVE: Preg, Serum: NEGATIVE

## 2023-10-29 MED ORDER — LIDOCAINE VISCOUS HCL 2 % MT SOLN: 15.0000 mL | Freq: Once | OROMUCOSAL | Status: DC

## 2023-10-29 MED ORDER — IBUPROFEN 400 MG PO TABS
600.0000 mg | ORAL_TABLET | Freq: Once | ORAL | Status: AC
  Administered 2023-10-29: 600 mg via ORAL
  Filled 2023-10-29: qty 1

## 2023-10-29 MED ORDER — ALUM & MAG HYDROXIDE-SIMETH 200-200-20 MG/5ML PO SUSP: 30.0000 mL | Freq: Once | ORAL | Status: DC

## 2023-10-29 NOTE — ED Triage Notes (Signed)
C/o left sided constant chest pain since yesterday. Denies other symptoms. Pain radiates to upper back.

## 2023-10-29 NOTE — Discharge Instructions (Addendum)
Your cardiac enzymes were normal and your chest x-ray and remaining labs were overall reassuring.  Your risk factors and presentation combined for a low risk presentation, recommend outpatient follow-up with your primary care provider, consideration for outpatient cardiology referral for consideration for outpatient cardiac stress testing.  Return for any severe worsening symptoms.

## 2023-10-29 NOTE — ED Provider Notes (Signed)
[place] [facility] AT [facility] Provider Note   CSN: [id number] Arrival date & time: 10/29/23  1019     History  Chief Complaint  Patient presents with   Chest Pain    Leslie Ortiz is a 47 y.o. female.  HPI   47 year old female with complicated medical history to include Sjogren syndrome with Raynaud's phenomenon, TMJ, dysautonomia, Hashimoto's thyroiditis, undifferentiated connective tissue disease who presents to the emergency department due to chest pain.  The patient states that she has had intermittent pain since yesterday, described as left-sided, radiating to her left shoulder blade, intermittently a squeezing and tight sensation and then resolves.  No cough or shortness of breath, no fevers or chills, no history of DVT or PE, no lower extremity swelling or recent long travel.  No ripping or tearing component to the pain.  The pain is not burning in characteristic.  It is not exertional.  She denies any musculoskeletal chest wall pain.  She denies any rash along the chest wall.  She is currently being treated for a presumptive diagnosis of shingles due to a lesion that appeared on her face.  Home Medications Prior to Admission medications   Medication Sig Start Date End Date Taking? Authorizing Provider  Ascorbic Acid (VITAMIN C) 500 MG CAPS 1,000 mg. 11/22/03   [provider]  Bacillus Coagulans-Inulin (PROBIOTIC) 1-250 BILLION-MG CAPS  10/21/04   [provider]  Calcium Carb-Cholecalciferol (CALCIUM 1000 + D) 1000-20 MG-MCG TABS  10/21/04   [provider]  CEQUA 0.09 % SOLN Apply 1 drop to eye 2 (two) times daily. 01/27/23   [provider]  cholecalciferol (VITAMIN D3) 25 MCG (1000 UNIT) tablet  11/22/03   [provider]  Coenzyme Q10 50 MG CAPS Take by mouth. 11/12/16   [provider]  cycloSPORINE (RESTASIS) 0.05 % ophthalmic emulsion Restasis 0.05 % eye drops in a dropperette Patient not taking:  Reported on 02/20/2023 05/13/18   [provider]  EPINEPHrine (AUVI-Q) 0.3 mg/0.3 mL IJ SOAJ injection Inject 0.3 mg into the muscle as needed for anaphylaxis. Patient not taking: Reported on 02/20/2023 07/25/20   Cristal Ford, MD  levothyroxine (SYNTHROID) 50 MCG tablet Take by mouth. 06/23/20 06/23/21  [provider]  LYRICA 50 MG capsule Take 50 mg by mouth 3 (three) times daily. 04/24/20   [provider]  Magnesium Gluconate 27.5 MG TABS Take by mouth. 11/12/16   [provider]  Omega-3 Fatty Acids (FISH OIL) 1200 MG CAPS  10/21/04   [provider]  PLAQUENIL 200 MG tablet Take 200 mg by mouth 2 (two) times daily. 07/15/20   [provider]      Allergies    Celecoxib, Coconut (cocos nucifera), Diphenhydramine hcl, Epinephrine, Levofloxacin, Sulfa antibiotics, Celery oil, Coconut oil, Codeine, Erythromycin, Penicillins, Tobramycin, and Cocos nucifera    Review of Systems   Review of Systems  Respiratory:  Positive for chest tightness.   Cardiovascular:  Positive for chest pain.  All other systems reviewed and are negative.   Physical Exam Updated Vital Signs BP 123/65   Pulse 78   Temp 98.1 F (36.7 C) (Oral)   Resp 14   Ht 5\' 4"  (1.626 m)   Wt 37.6 kg   SpO2 99%   BMI 14.25 kg/m  Physical Exam Vitals and nursing note reviewed.  Constitutional:      General: She is not in acute distress.    Appearance: She is well-developed.  Comments: Appears malnourished  HENT:     Head: Normocephalic and atraumatic.  Eyes:     Conjunctiva/sclera: Conjunctivae normal.  Cardiovascular:     Rate and Rhythm: Normal rate and regular rhythm.     Pulses: Normal pulses.     Comments: Pulses equal and symmetric bilaterally Pulmonary:     Effort: Pulmonary effort is normal. No respiratory distress.     Breath sounds: Normal breath sounds.  Chest:     Comments: No chest wall tenderness, no rash Abdominal:     Palpations:  Abdomen is soft.     Tenderness: There is no abdominal tenderness.  Musculoskeletal:        General: No swelling.     Cervical back: Neck supple.  Skin:    General: Skin is warm and dry.     Capillary Refill: Capillary refill takes less than 2 seconds.  Neurological:     Mental Status: She is alert.  Psychiatric:        Mood and Affect: Mood normal.     ED Results / Procedures / Treatments   Labs (all labs ordered are listed, but only abnormal results are displayed) Labs Reviewed  BASIC METABOLIC PANEL - Abnormal; Notable for the following components:      Result Value   Glucose, Bld 149 (*)    All other components within normal limits  CBC - Abnormal; Notable for the following components:   WBC 2.7 (*)    RBC 3.86 (*)    MCH 34.2 (*)    All other components within normal limits  RESP PANEL BY RT-PCR (RSV, FLU A&B, COVID)  RVPGX2  HCG, SERUM, QUALITATIVE  TROPONIN I (HIGH SENSITIVITY)  TROPONIN I (HIGH SENSITIVITY)    EKG EKG Interpretation Date/Time:  Wednesday October 29 2023 13:20:45 EST Ventricular Rate:  84 PR Interval:  123 QRS Duration:  94 QT Interval:  380 QTC Calculation: 450 R Axis:   83  Text Interpretation: Sinus rhythm Confirmed by Ernie Avena ([id number]) on 10/29/2023 1:24:12 PM  Radiology DG Chest 2 View Result Date: 10/29/2023 CLINICAL DATA:  Left-sided chest pain beginning yesterday. EXAM: CHEST - 2 VIEW COMPARISON:  12/09/2018 FINDINGS: The heart size and mediastinal contours are within normal limits. Stable pulmonary hyperinflation. Both lungs are clear. The visualized skeletal structures are unremarkable. IMPRESSION: Stable hyperinflation. No active cardiopulmonary disease. Electronically Signed   By: Danae Orleans M.D.   On: 10/29/2023 11:00    Procedures Procedures    Medications Ordered in ED Medications  ibuprofen (ADVIL) tablet 600 mg (600 mg Oral Given 10/29/23 1320)    ED Course/ Medical Decision Making/ A&P             HEART  Score: 3                    Medical Decision Making Amount and/or Complexity of Data Reviewed Labs: ordered. Radiology: ordered.  Risk OTC drugs. Prescription drug management.   47 year old female with complicated medical history to include Sjogren syndrome with Raynaud's phenomenon, TMJ, dysautonomia, Hashimoto's thyroiditis, undifferentiated connective tissue disease who presents to the emergency department due to chest pain.  The patient states that she has had intermittent pain since yesterday, described as left-sided, radiating to her left shoulder blade, intermittently a squeezing and tight sensation and then resolves.  No cough or shortness of breath, no fevers or chills, no history of DVT or PE, no lower extremity swelling or recent long travel.  No ripping or tearing  component to the pain.  The pain is not burning in characteristic.  It is not exertional.  She denies any musculoskeletal chest wall pain.  She denies any rash along the chest wall.  She is currently being treated for a presumptive diagnosis of shingles due to a lesion that appeared on her face.  Medical Decision Making: Marcinda Leaverton is a 47 y.o. female who presented to the ED today with chest pain, detailed above.  Based on patient's comorbidities, patient has a heart score of 3.    Patient placed on continuous vitals and telemetry monitoring while in ED which was reviewed periodically.  Complete initial physical exam performed, notably the patient was CTAB, intact symmetric pulses, no rash or chest wall tenderness.   Reviewed and confirmed nursing documentation for past medical history, family history, social history.    Initial Assessment: With the patient's presentation of left-sided chest pain, most likely diagnosis is esophageal spasm. Considered ACS, musculoskeletal chest pain versus GERD. Other diagnoses were considered including (but not limited to) pulmonary embolism, community-acquired pneumonia, aortic dissection,  pneumothorax, underlying bony abnormality, anemia. These are considered less likely due to history of present illness and physical exam findings.    In particular, concerning pulmonary embolism: Patient is PERC negative and the they deny malignancy, recent surgery, history of DVT, or calf tenderness leading to a low risk Wells score. Aortic Dissection also considered but seems less likely based on the location, quality, onset, and severity of symptoms in this case.   Initial Plan: Evaluate for ACS with delta troponin and EKG evaluated as below  Evaluate for dissection, bony abnormality, or pneumonia with chest x-ray and screening laboratory evaluation including CBC, BMP  Further evaluation for pulmonary embolism not indicated at this time based on patient's PERC and Wells score.  Further evaluation for Thoracic Aortic Dissection not indicated at this time based on patient's clinical history and PE findings.   Initial Study Results: EKG was reviewed independently. Rate, rhythm, axis, intervals all examined and without medically relevant abnormality. ST segments with nonspecific changes, no STEMI.  Laboratory  Delta troponin demonstrated normal values   CBC and BMP without obvious metabolic or inflammatory abnormalities requiring further evaluation   Radiology  DG Chest 2 View Result Date: 10/29/2023 CLINICAL DATA:  Left-sided chest pain beginning yesterday. EXAM: CHEST - 2 VIEW COMPARISON:  12/09/2018 FINDINGS: The heart size and mediastinal contours are within normal limits. Stable pulmonary hyperinflation. Both lungs are clear. The visualized skeletal structures are unremarkable. IMPRESSION: Stable hyperinflation. No active cardiopulmonary disease. Electronically Signed   By: Danae Orleans M.D.   On: 10/29/2023 11:00    Final Assessment and Plan: Patient was offered a GI cocktail but declined.  Also offered IV and oral pain medication, offered Ativan but declined.  Subsequently accepted oral  Motrin.  She primarily wants to rule out ACS.  Low concern for aortic dissection, PERC negative, low suspicion for PE.  Cardiac troponins were normal, COVID and flu influenza unremarkable, hCG negative, BMP unremarkable.  Chest x-ray revealed hyperinflation but no acute findings.  Patient declined any further medical interventions, low suspicion for acute intrathoracic etiology requiring further inpatient hospitalization or diagnostic testing.  Mended outpatient follow-up.  The patient has Flexeril at home which she will try for potential muscle spasm.  Strict return precautions provided, stable for outpatient follow-up.    Final Clinical Impression(s) / ED Diagnoses Final diagnoses:  Atypical chest pain    Rx / DC Orders ED Discharge Orders  None         Ernie Avena, MD 10/29/23 1416

## 2023-11-28 ENCOUNTER — Ambulatory Visit: Payer: 59 | Attending: Internal Medicine | Admitting: Internal Medicine

## 2023-11-28 ENCOUNTER — Encounter: Payer: Self-pay | Admitting: Internal Medicine

## 2023-11-28 VITALS — BP 109/69 | HR 71 | Ht 64.0 in | Wt 83.6 lb

## 2023-11-28 DIAGNOSIS — R072 Precordial pain: Secondary | ICD-10-CM

## 2023-11-28 DIAGNOSIS — I7381 Erythromelalgia: Secondary | ICD-10-CM

## 2023-11-28 DIAGNOSIS — G901 Familial dysautonomia [Riley-Day]: Secondary | ICD-10-CM

## 2023-11-28 DIAGNOSIS — I7300 Raynaud's syndrome without gangrene: Secondary | ICD-10-CM | POA: Diagnosis not present

## 2023-11-28 DIAGNOSIS — E063 Autoimmune thyroiditis: Secondary | ICD-10-CM

## 2023-11-28 DIAGNOSIS — E2839 Other primary ovarian failure: Secondary | ICD-10-CM

## 2023-11-28 DIAGNOSIS — I700 Atherosclerosis of aorta: Secondary | ICD-10-CM | POA: Diagnosis not present

## 2023-11-28 NOTE — Progress Notes (Signed)
Cardiology Office Note:  .   Date:  11/28/2023  ID:  Leslie Ortiz, DOB Nov 01, 1976, MRN [id number] PCP: Laqueta Due., MD   HeartCare Providers Cardiologist:  Parke Poisson, MD    History of Present Illness: .   Leslie Ortiz is a 48 y.o. female.  Discussed the use of AI scribe software for clinical note transcription with the patient, who gave verbal consent to proceed.  History of Present Illness   The patient, with a history of Sjogren's syndrome, Raynaud's, dysautonomia, Hashimoto's, and premature ovarian failure, presents with chest pain that started on Christmas Eve. The pain is described as a squeezing sensation located on the left side of the chest. The pain was initially dismissed as muscular, but it persisted and led to an ER visit. The patient also reports episodes of tachycardia, particularly upon standing up from a lying position. The chest pain eventually eased up but radiated to the back. The patient also experienced rib pain, describing it as if something was rubbing against the ribs. The pain then shifted to a sensation of a stick in the throat and difficulty swallowing. The chest pain is not associated with food intake and seems to worsen with physical activity, such as walking the dog, especially in cold weather. The patient also mentions a history of bradycardia and supraventricular tachycardia.        ROS: negative except per HPI above.  Studies Reviewed: Marland Kitchen   EKG Interpretation Date/Time:  Friday November 28 2023 14:31:58 EST Ventricular Rate:  71 PR Interval:  126 QRS Duration:  86 QT Interval:  426 QTC Calculation: 462 R Axis:   85  Text Interpretation: Normal sinus rhythm Normal ECG When compared with ECG of 29-Oct-2023 13:20, PREVIOUS ECG IS PRESENT Confirmed by Weston Brass ([id number]) on 11/28/2023 2:53:52 PM    Results   LABS Troponins: Normal (10/29/2023) WBC: 1.8 Neutrophils: 0.7  DIAGNOSTIC  Echocardiogram: Normal (01/2020)     Risk  Assessment/Calculations:    Physical Exam:   VS:  BP 109/69   Pulse 71   Ht 5\' 4"  (1.626 m)   Wt 83 lb 9.6 oz (37.9 kg)   SpO2 99%   BMI 14.35 kg/m    Wt Readings from Last 3 Encounters:  11/28/23 83 lb 9.6 oz (37.9 kg)  10/29/23 83 lb (37.6 kg)  02/20/23 82 lb (37.2 kg)     Physical Exam   CHEST: Lungs clear to auscultation. CARDIOVASCULAR: Heart sounds normal.     GEN: Well nourished, well developed in no acute distress NECK: No JVD; No carotid bruits CARDIAC: RRR, no murmurs, rubs, gallops RESPIRATORY:  Clear to auscultation without rales, wheezing or rhonchi  ABDOMEN: Soft, non-tender, non-distended EXTREMITIES:  No edema; No deformity   ASSESSMENT AND PLAN: .    Assessment & Plan Erythromelalgia (HCC)  Precordial pain  Calcification of aorta (HCC)  Raynaud's disease without gangrene  Autoimmune thyroiditis  Premature ovarian failure  Dysautonomia (HCC)   Assessment and Plan    Chest Pain New onset, left-sided, squeezing chest pain that started on Christmas Eve. Pain is not crushing but uncomfortable, and worsens with walking, especially in cold weather. Pain is associated with a sensation of throat flutters and difficulty swallowing. EKGs have been normal. -Order echocardiogram to evaluate for pericarditis and other structural abnormalities. -Order coronary CT scan to evaluate for coronary artery disease. -Order labs including CRP and sedimentation rate to evaluate for inflammation.  Sjogren's Syndrome History of Sjogren's syndrome with associated Raynaud's phenomenon  and dysautonomia. Patient has been managing symptoms with lifestyle modifications including increased salt intake and wearing compression socks. -Consider referral to rheumatology if inflammatory markers are elevated or if pericarditis is identified on echocardiogram.  Hashimoto's Thyroiditis Stable on levothyroxine daily. -Continue current management.  Neutropenia History of  neutropenia with low white blood cell count. Patient has had a previous bone marrow biopsy showing hypercellularity. -Continue monitoring with hematologist.  Follow-up in 4-6 weeks to discuss results of echocardiogram, coronary CT, and labs.

## 2023-11-28 NOTE — Patient Instructions (Addendum)
Medication Instructions:  Your physician recommends that you continue on your current medications as directed. Please refer to the Current Medication list given to you today.  *If you need a refill on your cardiac medications before your next appointment, please call your pharmacy*  Lab Work: BMET, CRP (C-reactive protein), Sed Rate (ESR) If you have labs (blood work) drawn today and your tests are completely normal, you will receive your results only by: MyChart Message (if you have MyChart) OR A paper copy in the mail If you have any lab test that is abnormal or we need to change your treatment, we will call you to review the results.  Testing/Procedures:  Your physician has requested that you have an echocardiogram. Echocardiography is a painless test that uses sound waves to create images of your heart. It provides your doctor with information about the size and shape of your heart and how well your heart's chambers and valves are working. This procedure takes approximately one hour. There are no restrictions for this procedure. Please do NOT wear cologne, perfume, aftershave, or lotions (deodorant is allowed). Please arrive 15 minutes prior to your appointment time. This will take place at [address] [place]     Your cardiac CT will be scheduled at one of the below locations:   [facility] [address] [place], [place] [address] [phone]   If scheduled at [facility], please arrive at the [facility] and Children's Entrance (Entrance C2) of [facility] 30 minutes prior to test start time. You can use the FREE valet parking offered at entrance C (encouraged to control the heart rate for the test)  Proceed to the [facility] Radiology Department (first floor) to check-in and test prep.  All radiology patients and guests should use entrance C2 at [facility], accessed from [facility], even though the hospital's physical  address listed is [address].   Please follow these instructions carefully (unless otherwise directed):  An IV will be required for this test and Nitroglycerin will be given.    On the Night Before the Test: Be sure to Drink plenty of water. Do not consume any caffeinated/decaffeinated beverages or chocolate 12 hours prior to your test. Do not take any antihistamines 12 hours prior to your test.  On the Day of the Test: Drink plenty of water until 1 hour prior to the test. Do not eat any food 1 hour prior to test. You may take your regular medications prior to the test.  If you take Furosemide/Hydrochlorothiazide/Spironolactone/Chlorthalidone, please HOLD on the morning of the test. Patients who wear a continuous glucose monitor MUST remove the device prior to scanning. FEMALES- please wear underwire-free bra if available, avoid dresses & tight clothing  After the Test: Drink plenty of water. After receiving IV contrast, you may experience a mild flushed feeling. This is normal. On occasion, you may experience a mild rash up to 24 hours after the test. This is not dangerous. If this occurs, you can take Benadryl 25 mg and increase your fluid intake. If you experience trouble breathing, this can be serious. If it is severe call 911 IMMEDIATELY. If it is mild, please call our office.  We will call to schedule your test 2-4 weeks out understanding that some insurance companies will need an authorization prior to the service being performed.   For more information and frequently asked questions, please visit our website : [email or web address]  For non-scheduling related  questions, please contact the cardiac imaging nurse navigator should you have any questions/concerns: Cardiac Imaging Nurse Navigators Direct Office Dial: [phone]   For scheduling needs, including cancellations and rescheduling, please call [place], [phone].   Follow-Up: At [facility], you and your health needs are our priority.  As part of our continuing mission to provide you with exceptional heart care, we have created designated Provider Care Teams.  These Care Teams include your primary Cardiologist (physician) and Advanced Practice Providers (APPs -  Physician Assistants and Nurse Practitioners) who all work together to provide you with the care you need, when you need it.  Your next appointment:    Need to call patient and ask if 01/27/2024 at 8:20 am works to follow up with Dr. Jacques Navy (Patient left after [employer] [address])  Provider:   Parke Poisson, MD

## 2023-12-01 ENCOUNTER — Ambulatory Visit (HOSPITAL_COMMUNITY): Payer: 59 | Attending: Cardiology

## 2023-12-01 DIAGNOSIS — R072 Precordial pain: Secondary | ICD-10-CM | POA: Diagnosis present

## 2023-12-01 LAB — ECHOCARDIOGRAM COMPLETE
Area-P 1/2: 3.3 cm2
S' Lateral: 2 cm

## 2023-12-03 LAB — BASIC METABOLIC PANEL
BUN/Creatinine Ratio: 23 (ref 9–23)
BUN: 13 mg/dL (ref 6–24)
CO2: 26 mmol/L (ref 20–29)
Calcium: 9.1 mg/dL (ref 8.7–10.2)
Chloride: 101 mmol/L (ref 96–106)
Creatinine, Ser: 0.57 mg/dL (ref 0.57–1.00)
Glucose: 110 mg/dL — ABNORMAL HIGH (ref 70–99)
Potassium: 4 mmol/L (ref 3.5–5.2)
Sodium: 142 mmol/L (ref 134–144)
eGFR: 112 mL/min/{1.73_m2} (ref >59)

## 2023-12-03 LAB — C-REACTIVE PROTEIN: CRP: <1 mg/L (ref 0–10)

## 2023-12-03 LAB — SEDIMENTATION RATE: Sed Rate: 2 mm/h (ref 0–32)

## 2023-12-08 ENCOUNTER — Encounter: Payer: Self-pay | Admitting: Internal Medicine

## 2023-12-18 ENCOUNTER — Telehealth (HOSPITAL_COMMUNITY): Payer: Self-pay | Admitting: *Deleted

## 2023-12-18 NOTE — Telephone Encounter (Signed)
Reaching out to patient to offer assistance regarding upcoming cardiac imaging study; pt verbalizes understanding of appt date/time, parking situation and where to check in, pre-test NPO status and verified current allergies; name and call back number provided for further questions should they arise  Larey Brick RN Navigator Cardiac Imaging [facility] [phone] office [phone] cell  Pt reports HR is the upper 50's to low 60's at home. She is aware to arrive at 11:30 AM.

## 2023-12-22 ENCOUNTER — Ambulatory Visit (HOSPITAL_COMMUNITY)
Admission: RE | Admit: 2023-12-22 | Discharge: 2023-12-22 | Disposition: A | Discharge status: Home / Self Care | Payer: 59 | Source: Ambulatory Visit | Attending: Internal Medicine | Admitting: Internal Medicine

## 2023-12-22 DIAGNOSIS — R072 Precordial pain: Secondary | ICD-10-CM | POA: Diagnosis present

## 2023-12-22 MED ORDER — DILTIAZEM HCL 25 MG/5ML IV SOLN: 10.0000 mg | INTRAVENOUS | Status: DC | PRN

## 2023-12-22 MED ORDER — IOHEXOL 350 MG/ML SOLN
70.0000 mL | Freq: Once | INTRAVENOUS | Status: AC | PRN
  Administered 2023-12-22: 70 mL via INTRAVENOUS

## 2023-12-22 MED ORDER — METOPROLOL TARTRATE 5 MG/5ML IV SOLN
10.0000 mg | INTRAVENOUS | Status: AC | PRN
  Administered 2023-12-22 (×2): 5 mg via INTRAVENOUS

## 2023-12-22 MED ORDER — IOHEXOL 350 MG/ML SOLN
95.0000 mL | Freq: Once | INTRAVENOUS | Status: AC | PRN
  Administered 2023-12-22: 95 mL via INTRAVENOUS

## 2023-12-22 MED ORDER — METOPROLOL TARTRATE 5 MG/5ML IV SOLN
INTRAVENOUS | Status: AC
Start: 2023-12-22 — End: ?
  Filled 2023-12-22: qty 10

## 2023-12-22 MED ORDER — NITROGLYCERIN 0.4 MG SL SUBL
0.8000 mg | SUBLINGUAL_TABLET | Freq: Once | SUBLINGUAL | Status: AC
  Administered 2023-12-22: 0.8 mg via SUBLINGUAL

## 2023-12-22 MED ORDER — NITROGLYCERIN 0.4 MG SL SUBL
SUBLINGUAL_TABLET | SUBLINGUAL | Status: AC
  Filled 2023-12-22: qty 2

## 2023-12-23 ENCOUNTER — Encounter: Payer: Self-pay | Admitting: *Deleted

## 2024-01-27 ENCOUNTER — Ambulatory Visit: Payer: 59 | Attending: Internal Medicine | Admitting: Internal Medicine

## 2024-01-27 ENCOUNTER — Encounter: Payer: Self-pay | Admitting: Internal Medicine

## 2024-01-27 VITALS — BP 116/74 | HR 62 | Ht 64.0 in | Wt 80.2 lb

## 2024-01-27 DIAGNOSIS — M3500 Sicca syndrome, unspecified: Secondary | ICD-10-CM

## 2024-01-27 DIAGNOSIS — E063 Autoimmune thyroiditis: Secondary | ICD-10-CM | POA: Diagnosis not present

## 2024-01-27 DIAGNOSIS — R072 Precordial pain: Secondary | ICD-10-CM | POA: Diagnosis not present

## 2024-01-27 DIAGNOSIS — I2510 Atherosclerotic heart disease of native coronary artery without angina pectoris: Secondary | ICD-10-CM | POA: Diagnosis not present

## 2024-01-27 DIAGNOSIS — I3139 Other pericardial effusion (noninflammatory): Secondary | ICD-10-CM | POA: Diagnosis not present

## 2024-01-27 NOTE — Progress Notes (Signed)
 Cardiology Office Note:  .   Date:  01/27/2024  ID:  Leslie Ortiz, DOB [birth year], MRN [id number] PCP: Laqueta Due., MD  [facility] Providers Cardiologist:  Parke Poisson, MD    History of Present Illness: .   Leslie Ortiz is a 48 y.o. female.  Discussed the use of AI scribe software for clinical note transcription with the patient, who gave verbal consent to proceed.  History of Present Illness   The patient, with a history of connective tissue disorder and Sjogren's syndrome, presents for a follow-up visit. She reports an improvement in her previously experienced chest pain. However, she continues to experience rib pain, which she describes as a strange sensation, as if her rib bones are hurting. The patient notes that the chest pain seemed to be more prevalent during cold weather and when she was trying to walk. She also mentions experiencing difficulty breathing when lying on her left side, which was particularly noticeable during her recent echo test.  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 She also mentions a potential diagnosis of asthma. The patient has a family history of intolerance to statins, which is relevant as she has some plaque and calcium in her arteries.        ROS: negative except per HPI above.  Studies Reviewed: .        Results   LABS CRP: normal Sedimentation rate: normal  RADIOLOGY CT scan: Multiple hypodense liver lesions, nonobstructive CAD, minimal pericardial fluid  DIAGNOSTIC Echocardiogram: Pericardial effusion, otherwise normal     Risk Assessment/Calculations:             Physical Exam:   VS:  BP 116/74   Pulse 62   Ht 5\' 4"  (1.626 m)   Wt 80 lb 3.2 oz (36.4 kg)   SpO2 100%   BMI 13.77 kg/m    Wt Readings from Last 3 Encounters:  01/27/24 80 lb 3.2 oz (36.4 kg)  11/28/23 83 lb 9.6 oz (37.9 kg)  10/29/23 83 lb (37.6 kg)     Physical Exam   GENERAL: Alert,  cooperative, well developed, no acute distress HEENT: Normocephalic, normal oropharynx, moist mucous membranes CHEST: Clear to auscultation bilaterally, no wheezes, rhonchi, or crackles CARDIOVASCULAR: Normal heart rate and rhythm, S1 and S2 normal without murmurs ABDOMEN: Soft, non-tender, non-distended, without organomegaly, normal bowel sounds EXTREMITIES: No cyanosis or edema NEUROLOGICAL: Cranial nerves grossly intact, moves all extremities without gross motor or sensory deficit      ASSESSMENT AND PLAN: .     Assessment and Plan    Chest Pain Intermittent chest pain improved. Echocardiogram showed small pericardial effusion, no significant abnormalities. Unlikely pericarditis due to normal CRP and sedimentation rate. Possible connective tissue disorder or rib discomfort. - Monitor symptoms and report recurrence or worsening. - Recheck CRP and sedimentation rate if symptoms recur. - Repeat echocardiogram if new symptoms develop.  Pericardial Effusion Small pericardial effusion reduced on CT. No acute pericarditis or significant inflammation. Likely benign, possibly related to previous inflammation or Sjogren's syndrome. - Monitor for symptoms of pericarditis or increased effusion. - Reassess with echocardiogram if symptoms suggest recurrence.  Nonobstructive Coronary Artery Disease (CAD) Nonobstructive CAD with plaque and calcium. No significant blockage. Low cholesterol, family history of statin intolerance. Potential genetic factors, including lipoprotein A. - Discuss statin therapy with primary care physician and rheumatologist. - Consider checking lipoprotein A levels. - Discuss alternative cholesterol-lowering options like Zetia if  statins not tolerated.  Sjogren's Syndrome Underlying Sjogren's syndrome may contribute to inflammation and symptoms. Normal CRP, sedimentation rate suggest no active inflammation. - Continue monitoring for symptoms related to Sjogren's  syndrome. - Follow up with rheumatologist for comprehensive management.  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 Current management includes copper supplementation per patient - Continue copper supplementation as prescribed by hematologist. - Monitor for cardiac or systemic symptoms related to copper metabolism disorder.  Follow-up - Schedule follow-up appointment in one year. - Contact cardiologist if new symptoms arise or concerns about current conditions.

## 2024-01-27 NOTE — Patient Instructions (Signed)
 Medication Instructions:  No Changes  Lab Work: None here today; please have your PCP draw LPa (Lipoprotein A); send [place] the results thru Epic or fax to [phone]  Follow-Up: At [facility], you and your health needs are our priority.  As part of our continuing mission to provide you with exceptional heart care, we have created designated Provider Care Teams.  These Care Teams include your primary Cardiologist (physician) and Advanced Practice Providers (APPs -  Physician Assistants and Nurse Practitioners) who all work together to provide you with the care you need, when you need it.   Your next appointment:   1 year(s)  ; we will mail you a reminder letter around January 2026, please call us then to make appointment for end of March 2026.  Provider:   Parke Poisson, MD      Other Instructions Please call us or send a MyChart message with any Cardiology related concerns. [phone]

## 2024-02-04 ENCOUNTER — Ambulatory Visit: Payer: 59 | Admitting: Pulmonary Disease

## 2024-02-04 ENCOUNTER — Encounter: Payer: Self-pay | Admitting: Pulmonary Disease

## 2024-02-04 VITALS — BP 108/88 | HR 77 | Ht 64.0 in | Wt 81.8 lb

## 2024-02-04 DIAGNOSIS — R059 Cough, unspecified: Secondary | ICD-10-CM | POA: Diagnosis not present

## 2024-02-04 DIAGNOSIS — J3089 Other allergic rhinitis: Secondary | ICD-10-CM | POA: Diagnosis not present

## 2024-02-04 DIAGNOSIS — R0989 Other specified symptoms and signs involving the circulatory and respiratory systems: Secondary | ICD-10-CM | POA: Diagnosis not present

## 2024-02-04 NOTE — Progress Notes (Signed)
 @Patient  ID: Leslie Ortiz, female    DOB: 11-15-1975, 48 y.o.   MRN: [id number]  Chief Complaint  Patient presents with   Consult    Pt states she need 2nd opian before starting med  .    Referring provider: Laqueta Due., MD  HPI:   48 y.o. woman whom are seen for evaluation of hyperinflation on chest imaging, intermittent chest tightness, shortness of breath with exertion particular with cold and warm weather, occasional rib pain.  Multiple notes from referring provider reviewed.  Most recent pulmonary note from [facility] reviewed.  Most recent ENT note reviewed.  Overall, she is doing well.  Day-to-day minimal to no symptoms.  Exercises exert herself without issue.  She notes.  With cold weather sometimes she gets shortness of breath or chest tightness or difficulty when walking her dog outside.  Similar sensation made with steamy shower hot shower or hot weather.  These are intermittent and not reliably reproduced.  She coughs occasionally.  She attributes to dry mouth related to her Sjogren's.  Sometimes her coughing fits she has chest or rib pain.  Localized.  She has had CT scan in 2019 that was reportedly clear.  She had a CT cardiac 12/2023 on my review insufficient reveals clear lungs, hyperinflation, no bronchiectasis, no other abnormality.  Chest x-ray 10/2023 reviewed interpreted as clear lungs with significant hyperinflation on both the PA and lateral view.  She does have seasonal allergy symptoms.  Atopic symptoms.  She denies a history of "asthma."  No other breathing issues or other issues in the past.  She is a never smoker.  Questionaires / Pulmonary Flowsheets:   ACT:      No data to display          MMRC:     No data to display          Epworth:      No data to display          Tests:   FENO:  No results found for: "NITRICOXIDE"  PFT:     No data to display        Spirometry [facility] 12/2023 with ratio of 64%, FEV1 69%,  moderate obstruction on my review interpretation  WALK:      No data to display          Imaging: Personally reviewed and as per EMR and discussion this note No results found.  Lab Results: Personally reviewed CBC    Component Value Date/Time   WBC 2.7 (L) 10/29/2023 1044   RBC 3.86 (L) 10/29/2023 1044   HGB 13.2 10/29/2023 1044   HCT 38.0 10/29/2023 1044   PLT 171 10/29/2023 1044   MCV 98.4 10/29/2023 1044   MCH 34.2 (H) 10/29/2023 1044   MCHC 34.7 10/29/2023 1044   RDW 12.1 10/29/2023 1044    BMET    Component Value Date/Time   NA 142 12/02/2023 1515   K 4.0 12/02/2023 1515   CL 101 12/02/2023 1515   CO2 26 12/02/2023 1515   GLUCOSE 110 (H) 12/02/2023 1515   GLUCOSE 149 (H) 10/29/2023 1044   BUN 13 12/02/2023 1515   CREATININE 0.57 12/02/2023 1515   CALCIUM 9.1 12/02/2023 1515   GFRNONAA >60 10/29/2023 1044    BNP No results found for: "BNP"  ProBNP No results found for: "PROBNP"  Specialty Problems       Pulmonary Problems   Allergic rhinitis with a nonallergic component  Allergies  Allergen Reactions   Celecoxib Hives and Rash    Hives    Coconut (Cocos Nucifera) Swelling   Diphenhydramine Hcl Palpitations    Other Reaction(s): Other (See Comments)  Twitching   Epinephrine Other (See Comments) and Palpitations    Twitch, heart goes crazy    Levofloxacin Hives and Rash    Hives    Sulfa Antibiotics Hives and Other (See Comments)    hives  hives  hives  hives, hives   Celery Oil     Other Reaction(s): Other   Coconut Oil     Other Reaction(s): Other (See Comments)  Positive allergy test 07/25/20   Codeine Nausea And Vomiting    nausea   Erythromycin Nausea And Vomiting   Penicillins Hives and Rash    Hives Hives    Tobramycin Swelling   Cocos Nucifera     Positive allergy test 07/25/20    Immunization History  Administered Date(s) Administered   Influenza-Unspecified 08/04/2012   PFIZER(Purple Top)SARS-COV-2  Vaccination 01/20/2020, 02/14/2020, 07/20/2020    Past Medical History:  Diagnosis Date   Dysautonomia (HCC)    Hashimoto's disease    Migraine    Neutropenia (HCC)    Osteoporosis    Sjogren's disease (HCC)    Small fiber neuropathy    Undifferentiated connective tissue disease (HCC)    Undifferentiated connective tissue disease (HCC)     Tobacco History: Social History   Tobacco Use  Smoking Status Never   Passive exposure: Never  Smokeless Tobacco Never   Counseling given: Not Answered   Continue to not smoke  Outpatient Encounter Medications as of 02/04/2024  Medication Sig   Ascorbic Acid (VITAMIN C) 500 MG CAPS 1,000 mg.   Bacillus Coagulans-Inulin (PROBIOTIC) 1-250 BILLION-MG CAPS    Calcium Carb-Cholecalciferol (CALCIUM 1000 + D) 1000-20 MG-MCG TABS    CEQUA 0.09 % SOLN Apply 1 drop to eye 2 (two) times daily.   cholecalciferol (VITAMIN D3) 25 MCG (1000 UNIT) tablet    Coenzyme Q10 50 MG CAPS Take by mouth.   Copper Gluconate 2 MG TABS Take 2 mg by mouth daily.   EPINEPHrine (AUVI-Q) 0.3 mg/0.3 mL IJ SOAJ injection Inject 0.3 mg into the muscle as needed for anaphylaxis.   LYRICA 50 MG capsule Take 50 mg by mouth 3 (three) times daily.   Magnesium Gluconate 27.5 MG TABS Take by mouth.   Omega-3 Fatty Acids (FISH OIL) 1200 MG CAPS    PLAQUENIL 200 MG tablet Take 200 mg by mouth 2 (two) times daily.   cycloSPORINE (RESTASIS) 0.05 % ophthalmic emulsion Restasis 0.05 % eye drops in a dropperette (Patient not taking: Reported on 02/20/2023)   levothyroxine (SYNTHROID) 50 MCG tablet Take by mouth.   No facility-administered encounter medications on file as of 02/04/2024.     Review of Systems  Review of Systems  No chest pain reliably with exertion.  No orthopnea or PND.  Comprehensive review of systems otherwise negative. Physical Exam  BP 108/88 (BP Location: Left Arm, Patient Position: Sitting, Cuff Size: Normal)   Pulse 77   Ht 5\' 4"  (1.626 m)   Wt 81 lb  12.8 oz (37.1 kg)   SpO2 100%   BMI 14.04 kg/m   Wt Readings from Last 5 Encounters:  02/04/24 81 lb 12.8 oz (37.1 kg)  01/27/24 80 lb 3.2 oz (36.4 kg)  11/28/23 83 lb 9.6 oz (37.9 kg)  10/29/23 83 lb (37.6 kg)  02/20/23 82 lb (37.2 kg)  BMI Readings from Last 5 Encounters:  02/04/24 14.04 kg/m  01/27/24 13.77 kg/m  11/28/23 14.35 kg/m  10/29/23 14.25 kg/m  02/20/23 14.19 kg/m     Physical Exam General: Sitting in chair, no acute distress, thin Eyes: EOMI, no icterus Neck: Supple, no JVP Pulmonary: Clear, normal work of breathing Cardiovascular: Warm, no edema Abdomen: Nondistended MSK: No synovitis, no joint effusion Skin: Erythematous finger digits noted Neuro: Normal gait, no weakness Psych: Normal mood, full affect   Assessment & Plan:   Hyperinflation on chest x-ray: Seen on CT scan.  Never smoker.  Most likely asthma.  Specially given minimal symptoms.  Bronchiolitis obliterans considered.  This is hard to diagnose.  This will require open lung biopsy.  Does not seem necessary given her minimal symptoms.  Occasional cough, chest tightness with cold weather etc. with exertion: High suspicion of asthma given hyperinflation.  Also with atopic symptoms.  See discussion above.  Symptoms are intermittent and relatively minimal.  Try albuterol as needed.  Consider maintenance inhaler in the future if symptoms were to worsen.  Fixed obstruction on spirometry: Diagnosis includes the above.   No follow-ups on file.   Karren Burly, MD 02/04/2024   This appointment required 62 minutes of patient care (this includes precharting, chart review, review of results, face-to-face care, etc.).

## 2024-02-04 NOTE — Patient Instructions (Signed)
 It was nice to meet you  I think the diagnosis of asthma is probably correct  This is based on your pulmonary function test that do show some reduction, in the absence of a history of cigarette smoking asthma is by far the most likely cause.  Your chest x-ray and CT scans in the past with hyperinflated, in the absence of history of cigarette smoking asthma/the most likely cause.  We treat asthma based on the severity of symptoms.  Given the intermittent nature of your symptoms I think using ongoing albuterol as needed would be a reasonable first step.  I recommend using this prior to activities that tend to reproduce concerning symptoms such as the cold etc.  It is possible that chest discomfort or pain around the rib is related to inflammation related to asthma or this could be unrelated and more related to another inflammatory process.  Return to clinic as needed

## 2024-10-14 NOTE — Progress Notes (Signed)
 [facility] [place] NEW PATIENT EVALUATION   Referring Physician:  Deane Camie HERO, MD Primary Care Physician:  Camie HERO Deane, MD   Patient ID:  Leslie Ortiz is a 48 y.o. (DOB 19-Apr-1976) female.    Subjective   Leslie Ortiz is a 48 y.o. female referred for evaluation. History of Present Illness The patient presents for evaluation of small fiber neuropathy.  She has been diagnosed with small fiber neuropathy, which she believes affects her entire body. She suspects this condition is a result of her Sjogren's syndrome. She has undergone nerve tests in the past and has been dealing with these symptoms since her early 55s. She was diagnosed with Sjogren's syndrome in 2016. She is immunocompromised due to low white blood cell count. She experiences a range of symptoms including digestive issues, myalgia, heat and cold intolerance, POTS, sweating, burning mouth, and pudendal neuropathy, which led to her discontinuing her job. She has not sought treatment at [facility]. She has undergone a liver biopsy and bone marrow biopsy. She was initially told she had Peyronie's disease, but it was later determined that she has multiple cysts in her liver. Her liver counts were significantly high for a couple of years. She has nonobstructive coronary artery disease, which she attributes to premature ovarian failure in her 63s, leading to years without estrogen. This also resulted in significant osteoporosis. She first started experiencing issues in graduate school in 2000, when she developed Raynaud's disease and ulcers on her toes. An ANA test and urine test revealed protein leakage and high ANA levels, indicating an autoimmune disease. Her liver counts and blood counts were also abnormal. She has always experienced severe migraines. She underwent an intestinal biopsy 20 years ago due to suspected celiac disease, as she does not absorb fat and has high levels of fecal fat. She believes her small fiber  neuropathy impacts her motility. She is currently taking Lyrica for her neuropathy, which she feels is not a sufficient treatment.  She has a copper deficiency and is under the care of a hematologist. Despite taking copper supplements, her levels remain low. She was previously excreting excess copper in her urine, leading to a suspicion of Wilson's disease. Her ceruloplasmin was low during her last test. She does not believe her copper deficiency is causing her neuropathy, as she only developed the deficiency in the last year or two.  She has been experiencing symptoms of dysautonomia, which she describes as intermittent and not a new development. Her primary care physician initially suspected an ENT issue but later considered a neurological cause. She underwent a tilt table test with Dr. Alycia, a cardiologist, and was diagnosed with neurocardiogenic syncope.  Also notes that she had an abnormal sweat test in her 28s.  Results were not positive for cystic fibrosis, although were reportedly more borderline.  Had additional testing years later that was slightly more positive, but really has not had any significant lung issues or breathing issues consistent with CF.  SOCIAL HISTORY:   Education Level: Graduate school  PAST SURGICAL HISTORY: Liver biopsy, bone marrow biopsy  FAMILY HISTORY - Mother: Rheumatoid arthritis - Father: Myasthenia gravis Negative for Sjogren's syndrome in the family history.    Past Medical History, Past Surgery History, Social History, and Family History were reviewed and updated.    History reviewed. No pertinent past medical history.  Past Surgical History:  Procedure Laterality Date   Upper gastrointestinal endoscopy     2004   Family History[1] Social History[2]  Current Home Medications  Medication Sig  Ascorbic Acid (VITAMIN C) 500 mg CAPS   Bacillus Coagulans-Inulin (PROBIOTIC) 1-250 BILLION-MG CAPS   Calcium Carb-Cholecalciferol (CALCIUM 1000 +  D) 1000-20 MG-MCG TABS   Cholecalciferol (VITAMIN D) 25 mcg (1000 Units) tablet   Coenzyme Q10 (COQ-10) 100 MG CAPS   hydroxychloroquine (PLAQUENIL) 200 MG tablet   levothyroxine sodium (SYNTHROID) 50 mcg tablet   Magnesium 500 MG CAPS   Omega-3 Fatty Acids (FISH OIL) 1200 mg capsule   pregabalin (LYRICA) 50 mg capsule     The patient is allergic to celecoxib, coconut, diphenhydramine hcl, epinephrine , levofloxacin, sulfa antibiotics, celery, coconut (cocos nucifera) allergy  skin test, coconut oil, codeine, erythromycin, erythromycin base, levaquin, penicillins, and tobramycin.  Review of Systems is complete and negative except as noted in History of Present Illness.  Objective    PHYSICAL EXAM: BP 106/65 (BP Location: Left Upper Arm, Patient Position: Sitting)   Pulse 70   Ht 5' 4 (1.626 m)   Wt 82 lb 9.6 oz (37.5 kg)   SpO2 100%   BMI 14.18 kg/m  GENERAL:  No acute distress.  SKIN:   Inspection: well perfused, erythremia in the bilateral hands  MENTAL STATUS EXAM: Orientation: Alert and oriented to person, place and time. Memory: Cooperative, follows commands well. Recent and remote memory normal.. Attention, concentration: Attention span and concentration are normal. Language: Speech is clear and language is normal. Fund of knowledge: Aware of current events, vocabulary appropriate for patient age.   CRANIAL NERVES: CN 2 (Optic): Visual fields intact to confrontation CN 3,4,6 (EOM): Full extraocular eye movement without nystagmus. CN 7 (Facial): No facial weakness or asymmetry. CN 8 (Auditory): Auditory acuity grossly normal. CN 11 (spinal access): Normal sternocleidomastoid and trapezius strength.   MOTOR: Muscle Strength: Strength - 5/5 in the upper and lower extremities, no pronation or drift.  COORDINATION:   No tremor.   SENSATION:  Intact to light touch.  GAIT: Routine gait is normal.     DIAGNOSTIC STUDIES:   Tilt-Table Study (04/25/2021): .SABRASABRADuring  the initial heads-up tilt table position the patient's vital signs remained stable until 16 minutes into the study when the patient began to complain of dizziness, nausea, presyncope, and symptoms identical to their clinical symptoms. During the heads-up tilt table position the patient's systolic blood pressure ranged from 102 mmHg to 120 mmHg and heart rate ranged from 70 bpm to 95 bpm. When the patient developed clinical symptoms during the tilt table study patient's blood pressure was 83 / 50 and heart rate was 78 bpm. During the patient's clinical symptoms with the tilt table study the patient did develop pre-syncope... Impression: Tilt table study positive for neurocardiogenic mechanism to explain the patient's clinical symptoms. Patient with mainly a Vaso-depressor response.  MRI of the brain with and without contrast (02/28/2021):  No acute intracranial abnormality.  MRI of the thoracic spine with and without contrast (01/05/2019):  Normal MRI of the thoracic spine.  EMG/NCS @ [facility] (02/13/2017):  This is an unremarkable study. There is no clear evidence for a right lumbosacral radiculopathy versus right leg mononeuropathy.  MRI of the lumbar spine without contrast (02/06/2017):  Mild spondylosis most notable at L5-S1 where there is a shallow broad-based disc bulge without central canal or foraminal stenosis. The central canal and foramina appear open at all levels. Rudimentary disc material at S1-2. Please correlate with plain films if any intervention is planned.  Skin Biopsy (11/16/2014): There is no evidence of vasculitis or other histological  abnormalities based on H&E stain. No amyloid is detected by the [place] Red stain. Both of these disorders are focal and this analysis does not exclude the diagnosis.   CT abdomen and pelvis 08/06/2023 No acute findings No bowel dilation or wall thickening multiple hepatic cysts  LABORATORY STUDIES:   Lab Results  Component Value Date    Glucose, Random External 88 08/03/2024   Hemoglobin A1c EXTERNAL 5.1 11/12/2023   TSH External 1.70 08/16/2022   IgA 261 07/23/2023   Total Protein External 7.1 08/03/2024   Gyn Note Comment 09/18/2012   CRP <1 07/23/2023   Magnesium External 2.4 08/16/2022   Prior Serologic Studies:  Normal/negative -ESR, SSA, SSB, anti-sm antibody, anti-Jo1, anti-Scl70, RF, ENA, SPEP/IFE, Vitamin B12, Folate, Vitamin A, Vitamin E.  Abnormal labs: ANA 1:320, + salivary land biopsy. CBC abnormal with leukopenia and macrocytic anemia.  Labs 10/16/22:  Normal/negative-copper, B1, B3, B6, vitamin E, B12, MMA, folate, SPEP/IFE, FLC.  Gene Dx hereditary sensory and autonomic neuropathy panel was negative.    Assessment / Plan   Assessment & Plan 1. Small fiber neuropathy. Been diagnosed with a small fiber neuropathy, that I doubt this is actually the primary cause, but rather a symptom of of a much bigger issue, potentially genetic.  She is currently on Lyrica, which provides some relief but does not treat the underlying condition. The complexity of her case necessitates a thorough review of her medical records, including previous nerve tests and biopsies.  Based on everything, I would wonder about some type of absorption type problem given that she had a history of vitamin deficiencies, GI problems and multiple system involvement.  Could consider an L-carnitine transferase deficiency or possibly even changing fatty acid deficiency.  Very long chain acetyl-CaA dehydrogenase deficiency could also be a possibility, but admittedly a lot of these are beyond my scope of expertise.  It is doubtful that additional neurologic testing would be of benefit (as she has already had an extremely thorough workup at different institutions), but I would like to review the records further to see if at least we could consider some alternative strategies for going forward or at least a way to target her workup.  2. Sjogren's  syndrome. The patient has a history of Sjogren's syndrome, diagnosed in 2016, which may be contributing to her neuropathy. She reports various symptoms, including dry mouth and eyes, which are typical of Sjogren's syndrome. A referral to [facility] for further evaluation of her Sjogren's syndrome is already planned for next year.  3. Copper deficiency. The patient has a documented copper deficiency in spite of taking copper supplements.  This is somewhat strange given that she seemed to have a surplus in the past but now more of the deficiency.  While the deficiency can easily result in some paresthesias and other neurologic issues, I do not have a good cause for this other than conjecture about a possible absorption abnormality.  He did have some type of malabsorption syndrome, I was discussing with the neuromuscular specialist that medium-chain fatty acid supplements from [facility] might be an option since these are more easily absorbed in the GI tract.  4. Dysautonomia. The patient reports symptoms consistent with dysautonomia, including heat and cold intolerance, sweating, and POTS.  Again, unclear how much of this is just more of a symptom of a bigger underlying problem versus an independent issue.    Risks, benefits, and alternatives of the medications and treatment plan prescribed today were discussed, and patient expressed understanding  and agreement with the plan.  All new prescription medications and changes in current prescription dosages were discussed with the patient, including patient education, medication name, use, dosage, potential side effects, drug interactions, consequences of not using/taking and special instructions. Patient expressed understanding. No barriers to adherence.  Documentation for time-based billing:  Total time spent of date of service was 75 minutes.  Patient care activities included preparing to see the patient such as reviewing the patient record, obtaining and/or  reviewing separately obtained history, performing a medically appropriate history and physical examination, counseling and educating the patient, family, and/or caregiver, referring and communicating with other health care providers when not separately reported during the visit, documenting clinical information in the electronic or other health record, independently interpreting results when not separately reported, and coordinating the care of the patient when not separately reported.  Medford Blumenthal, MD 10/14/2024, 5:21 PM  Computer technology was used to create this visit note.  Consent for patient/caregiver was given obtained prior to its use.  *Additional portions of this note was dictated with voice recognition software. Inadvertently, similar sounding words can, sometimes, get transcribed incorrectly       [1] Family History Problem Relation Name Age of Onset   Anemia Paternal Grandfather     Crohn's disease Cousin     Leukemia Maternal Grandmother     Prostate cancer Maternal Grandfather     Skin cancer Paternal Grandmother    [2] Social History Socioeconomic History   Marital status: Single  Tobacco Use   Smoking status: Never    Passive exposure: Never   Smokeless tobacco: Never  Vaping Use   Vaping status: Never Used  Substance and Sexual Activity   Alcohol use: Never   Drug use: Never

## 2024-10-27 ENCOUNTER — Encounter: Payer: Self-pay | Admitting: Emergency Medicine

## 2024-10-27 ENCOUNTER — Ambulatory Visit: Admission: EM | Admit: 2024-10-27 | Discharge: 2024-10-27 | Disposition: A | Discharge status: Home / Self Care

## 2024-10-27 ENCOUNTER — Ambulatory Visit: Admitting: Radiology

## 2024-10-27 DIAGNOSIS — R0789 Other chest pain: Secondary | ICD-10-CM | POA: Diagnosis not present

## 2024-10-27 DIAGNOSIS — S2341XA Sprain of ribs, initial encounter: Secondary | ICD-10-CM | POA: Diagnosis not present

## 2024-10-27 MED ORDER — DICLOFENAC SODIUM 50 MG PO TBEC: 50.0000 mg | DELAYED_RELEASE_TABLET | Freq: Two times a day (BID) | ORAL | 1 refills | Status: AC

## 2024-10-27 NOTE — ED Provider Notes (Signed)
 " [facility]  Note:  This document was prepared using Dragon voice recognition software and may include unintentional dictation errors.  MRN: [id number] DOB: December 01, 1975  Subjective:   Leslie Ortiz is a 48 y.o. female presenting for pain to the left lateral and anterior ribs x 2 weeks.  Patient reports increased pain with breathing deep, coughing, sneezing, rotational movements.  Patient states that the pain feels like sharp stabbing nerve pain, 7 out of 10.  Patient denies any known injury or persistent cough.  Patient states that prior to the onset of pain she was wrapping lifts and lifting packages which states may have caused some aggravation.  Patient states that symptoms were initially getting better then she went and got a massage and after massage symptoms were exacerbated.  Current Medications[1]   Allergies[2]  Past Medical History:  Diagnosis Date   Dysautonomia (HCC)    Hashimoto's disease    Migraine    Neutropenia    Osteoporosis    Sjogren's disease    Small fiber neuropathy    Undifferentiated connective tissue disease    Undifferentiated connective tissue disease      Past Surgical History:  Procedure Laterality Date   tear duct replaced Left 2008   WISDOM TOOTH EXTRACTION  1991    Family History  Problem Relation Age of Onset   Arthritis/Rheumatoid Mother    Hypertension Mother    Allergic rhinitis Sister    Allergic rhinitis Brother    Bronchitis Brother    Leukemia Maternal Grandmother    Allergic rhinitis Paternal Grandmother    Lupus Paternal Grandmother    Bronchitis Maternal Aunt    Asthma Neg Hx    Eczema Neg Hx    Urticaria Neg Hx    Immunodeficiency Neg Hx    Angioedema Neg Hx     Social History[3]  ROS Refer to HPI for ROS details.  Objective:   Vitals: BP 125/73 (BP Location: Right Arm)   Pulse 79   Temp 97.9 F (36.6 C) (Oral)   Resp 16   SpO2 98%   Physical Exam Vitals and nursing note reviewed.   Constitutional:      General: She is not in acute distress.    Appearance: She is well-developed. She is not ill-appearing or toxic-appearing.  HENT:     Head: Normocephalic and atraumatic.     Mouth/Throat:     Mouth: Mucous membranes are moist.  Cardiovascular:     Rate and Rhythm: Normal rate.  Pulmonary:     Effort: Pulmonary effort is normal. No respiratory distress.     Breath sounds: No stridor. No wheezing.  Chest:     Chest wall: Tenderness (Left lateral and anterior chest wall pain with palpation, deep breathing, movement) present.  Musculoskeletal:        General: Normal range of motion.  Skin:    General: Skin is warm and dry.  Neurological:     General: No focal deficit present.     Mental Status: She is alert and oriented to person, place, and time.  Psychiatric:        Mood and Affect: Mood normal.        Behavior: Behavior normal.     Procedures  No results found for this or any previous visit (from the past 24 hours).  DG Ribs Unilateral W/Chest Left Result Date: 10/27/2024 CLINICAL DATA:  Left rib pain EXAM: LEFT RIBS AND CHEST - 3+ VIEW COMPARISON:  October 29, 2023 trauma  February 16, 2024 FINDINGS: No acute displaced rib fracture. There is no evidence of pneumothorax or pleural effusion. Similar biapical pleuroparenchymal scarring. No acute pleuroparenchymal abnormality. Heart size and mediastinal contours are within normal limits. IMPRESSION: No acute displaced rib fracture. Electronically Signed   By: Corean Salter M.D.   On: 10/27/2024 12:35     Assessment and Plan :     Discharge Instructions       1. Sprain of costal cartilage, initial encounter (Primary) - DG Ribs Unilateral W/Chest Left complete in UC shows no acute fracture or dislocation to the left ribs, normal aeration to bilateral lungs, no consolidation or pneumonia - diclofenac  (VOLTAREN ) 50 MG EC tablet; Take 1 tablet (50 mg total) by mouth 2 (two) times daily.  Dispense: 30  tablet; Refill: 1  -Continue to monitor symptoms for any change in severity if there is any escalation of current symptoms or development of new symptoms follow-up in ER for further evaluation and management.      Loukas Antonson B Caelin Rayl    [1] No current facility-administered medications for this encounter.  Current Outpatient Medications:    diclofenac  (VOLTAREN ) 50 MG EC tablet, Take 1 tablet (50 mg total) by mouth 2 (two) times daily., Disp: 30 tablet, Rfl: 1   Ascorbic Acid (VITAMIN C) 500 MG CAPS, 1,000 mg., Disp: , Rfl:    Bacillus Coagulans-Inulin (PROBIOTIC) 1-250 BILLION-MG CAPS, , Disp: , Rfl:    Calcium Carb-Cholecalciferol (CALCIUM 1000 + D) 1000-20 MG-MCG TABS, , Disp: , Rfl:    CEQUA 0.09 % SOLN, Apply 1 drop to eye 2 (two) times daily., Disp: , Rfl:    cholecalciferol (VITAMIN D3) 25 MCG (1000 UNIT) tablet, , Disp: , Rfl:    Coenzyme Q10 50 MG CAPS, Take by mouth., Disp: , Rfl:    Copper Gluconate 2 MG TABS, Take 2 mg by mouth daily., Disp: , Rfl:    EPINEPHrine  (AUVI-Q ) 0.3 mg/0.3 mL IJ SOAJ injection, Inject 0.3 mg into the muscle as needed for anaphylaxis., Disp: 2 each, Rfl: 1   levothyroxine (SYNTHROID) 50 MCG tablet, Take by mouth., Disp: , Rfl:    LYRICA 50 MG capsule, Take 50 mg by mouth 3 (three) times daily., Disp: , Rfl:    Magnesium Gluconate 27.5 MG TABS, Take by mouth., Disp: , Rfl:    Omega-3 Fatty Acids (FISH OIL) 1200 MG CAPS, , Disp: , Rfl:    PLAQUENIL 200 MG tablet, Take 200 mg by mouth 2 (two) times daily., Disp: , Rfl:  [2]  Allergies Allergen Reactions   Celecoxib Hives and Rash    Hives    Coconut (Cocos Nucifera) Swelling   Diphenhydramine Hcl Palpitations    Other Reaction(s): Other (See Comments)  Twitching   Epinephrine  Other (See Comments) and Palpitations    Twitch, heart goes crazy    Levofloxacin Hives and Rash    Hives    Sulfa Antibiotics Hives and Other (See Comments)    hives  hives  hives  hives, hives   Celery Oil      Other Reaction(s): Other   Coconut Oil     Other Reaction(s): Other (See Comments)  Positive allergy  test 07/25/20   Codeine Nausea And Vomiting    nausea   Erythromycin Nausea And Vomiting   Penicillins Hives and Rash    Hives Hives    Tobramycin Swelling   Cocos Nucifera     Positive allergy  test 07/25/20  [3]  Social History Tobacco Use   Smoking  status: Never    Passive exposure: Never   Smokeless tobacco: Never  Vaping Use   Vaping status: Never Used  Substance Use Topics   Alcohol use: Never   Drug use: Never     Aurea Goodell B, NP 10/27/24 1319  "

## 2024-10-27 NOTE — Discharge Instructions (Addendum)
" °  1. Sprain of costal cartilage, initial encounter (Primary) - DG Ribs Unilateral W/Chest Left complete in UC shows no acute fracture or dislocation to the left ribs, normal aeration to bilateral lungs, no consolidation or pneumonia - diclofenac  (VOLTAREN ) 50 MG EC tablet; Take 1 tablet (50 mg total) by mouth 2 (two) times daily.  Dispense: 30 tablet; Refill: 1  -Continue to monitor symptoms for any change in severity if there is any escalation of current symptoms or development of new symptoms follow-up in ER for further evaluation and management. "

## 2024-10-27 NOTE — ED Triage Notes (Signed)
 Pt c/o rib pain on left rib cage that goes from back to front of lower chest for 2 weeks.. States it feels like nerve pain and is sharp when breathing.   Denies recent cough or known injury. States she has been wrapping gifts and twisting.

## 2025-02-01 ENCOUNTER — Ambulatory Visit: Admitting: Internal Medicine
